# Patient Record
Sex: Female | Born: 1935
Health system: Southern US, Community
[De-identification: ages and names within clinical notes are randomized; demographics above are authoritative.]

## PROBLEM LIST (undated history)

## (undated) DIAGNOSIS — T7840XA Allergy, unspecified, initial encounter: Secondary | ICD-10-CM

## (undated) DIAGNOSIS — C801 Malignant (primary) neoplasm, unspecified: Secondary | ICD-10-CM

## (undated) DIAGNOSIS — H269 Unspecified cataract: Secondary | ICD-10-CM

## (undated) DIAGNOSIS — E119 Type 2 diabetes mellitus without complications: Secondary | ICD-10-CM

## (undated) DIAGNOSIS — M8580 Other specified disorders of bone density and structure, unspecified site: Secondary | ICD-10-CM

## (undated) DIAGNOSIS — E785 Hyperlipidemia, unspecified: Secondary | ICD-10-CM

## (undated) DIAGNOSIS — I10 Essential (primary) hypertension: Secondary | ICD-10-CM

## (undated) DIAGNOSIS — K449 Diaphragmatic hernia without obstruction or gangrene: Secondary | ICD-10-CM

## (undated) DIAGNOSIS — K219 Gastro-esophageal reflux disease without esophagitis: Secondary | ICD-10-CM

## (undated) HISTORY — DX: Malignant (primary) neoplasm, unspecified: C80.1

## (undated) HISTORY — DX: Hyperlipidemia, unspecified: E78.5

## (undated) HISTORY — DX: Unspecified cataract: H26.9

## (undated) HISTORY — PX: BREAST BIOPSY: SHX20

## (undated) HISTORY — DX: Other specified disorders of bone density and structure, unspecified site: M85.80

## (undated) HISTORY — DX: Essential (primary) hypertension: I10

## (undated) HISTORY — DX: Allergy, unspecified, initial encounter: T78.40XA

## (undated) HISTORY — DX: Type 2 diabetes mellitus without complications: E11.9

## (undated) HISTORY — DX: Diaphragmatic hernia without obstruction or gangrene: K44.9

## (undated) HISTORY — PX: BREAST LUMPECTOMY: SHX2

## (undated) HISTORY — DX: Gastro-esophageal reflux disease without esophagitis: K21.9

---

## 2017-11-18 ENCOUNTER — Other Ambulatory Visit: Payer: Self-pay | Admitting: Family Medicine

## 2017-11-18 DIAGNOSIS — Z1231 Encounter for screening mammogram for malignant neoplasm of breast: Secondary | ICD-10-CM

## 2017-12-10 ENCOUNTER — Other Ambulatory Visit: Payer: Self-pay | Admitting: Family Medicine

## 2017-12-10 ENCOUNTER — Ambulatory Visit
Admission: RE | Admit: 2017-12-10 | Discharge: 2017-12-10 | Disposition: A | Discharge status: Home / Self Care | Payer: Medicare Other | Source: Ambulatory Visit | Attending: Family Medicine | Admitting: Family Medicine

## 2017-12-10 DIAGNOSIS — Z1231 Encounter for screening mammogram for malignant neoplasm of breast: Secondary | ICD-10-CM

## 2018-03-11 ENCOUNTER — Other Ambulatory Visit: Payer: Self-pay

## 2018-03-11 ENCOUNTER — Ambulatory Visit: Payer: Medicare Other | Attending: Family Medicine

## 2018-03-11 DIAGNOSIS — R42 Dizziness and giddiness: Secondary | ICD-10-CM | POA: Diagnosis present

## 2018-03-11 DIAGNOSIS — R2689 Other abnormalities of gait and mobility: Secondary | ICD-10-CM | POA: Insufficient documentation

## 2018-03-11 DIAGNOSIS — M6281 Muscle weakness (generalized): Secondary | ICD-10-CM | POA: Insufficient documentation

## 2018-03-11 NOTE — Therapy (Signed)
[facility] [address] [place], [place], [address] Phone: [phone]   Fax:  [phone]  Physical Therapy Evaluation  Patient Details  Name: Andrea Wong MRN: [id number] Date of Birth: 01/16/1934 Referring Provider: Dr. Alroy Dust   Encounter Date: 03/11/2018  PT End of Session - 03/11/18 1244    Visit Number  1    Number of Visits  17    Date for PT Re-Evaluation  05/10/18    Authorization Type  [employer] Medicare: progress note every 10th visit. No visit limit.    PT Start Time  1017    PT Stop Time  1057    PT Time Calculation (min)  40 min    Equipment Utilized During Treatment  Gait belt    Activity Tolerance  Patient tolerated treatment well    Behavior During Therapy  WFL for tasks assessed/performed       Past Medical History:  Diagnosis Date  . Allergy    seasonal   . Cancer ([place])    breast CA (1970), Uterine CA (2003)  . Cataract    B cataracts extracted 2019  . Diabetes mellitus without complication ([place])   . GERD (gastroesophageal reflux disease)   . Hiatal hernia   . Hyperlipidemia   . Hypertension   . Osteopenia     Past Surgical History:  Procedure Laterality Date  . BREAST BIOPSY    . BREAST LUMPECTOMY Left    1970    There were no vitals filed for this visit.   Subjective Assessment - 03/11/18 1028    Subjective  Pt reports she's very unsteady on her feet, which began about 3-4 months ago. Pt denied illness or accident prior to balance issues. Pt has difficulty turning and stepping down from curbs. Pt amb. back to room with one hand on her husbands rollator.  Pt denied falls in last 6 months. Pt feels more unsteady vs. dizziness.     Patient is accompained by:  Family member husband: Andrea Wong    Pertinent History  DM, hx of breast and uterine CA (in remission-CA was years ago), osteopenia, recent B cataract extraction ( no improvement with balance), HTN, HLD    Patient Stated Goals  Walk  with better balance, walk longer distances for exercises    Currently in Pain?  No/denies         [facility] PT Assessment - 03/11/18 1034      Assessment   Medical Diagnosis  Balance disorder    Referring Provider  Dr. Alroy Dust    Onset Date/Surgical Date  11/09/17 3-4 months, getting progressively worse    Hand Dominance  Right    Prior Therapy  none      Precautions   Precautions  Fall      Restrictions   Weight Bearing Restrictions  No      Balance Screen   Has the patient fallen in the past 6 months  No    Has the patient had a decrease in activity level because of a fear of falling?   Yes    Is the patient reluctant to leave their home because of a fear of falling?   Yes      [place] residence    Living Arrangements  Spouse/significant other    Available Help at Discharge  Family dtr lives down the street    Type of [place] to  enter    Entrance Stairs-Number of Steps  2    Entrance Stairs-Rails  None    Home Layout  One level    Home Equipment  Grab bars - tub/shower;Shower seat;Bedside commode      Prior Function   Level of Independence  Independent    Vocation  Retired    [employer], play cards, crossword puzzles      Cognition   Overall Cognitive Status  Within Functional Limits for tasks assessed      Sensation   Light Touch  Appears Intact    Additional Comments  No c/o N/T      Coordination   Gross Motor Movements are Fluid and Coordinated  Yes      Posture/Postural Control   Posture/Postural Control  Postural limitations    Postural Limitations  Forward head      ROM / Strength   AROM / PROM / Strength  AROM;Strength      AROM   Overall AROM   Within functional limits for tasks performed    Overall AROM Comments  BUE/LE WFL      Strength   Overall Strength  Deficits    Overall Strength Comments  BLE: 5/5 strength except for 4/5 knee ext and hip abd. in seated position.        Transfers   Transfers  Sit to Stand;Stand to Sit    Sit to Stand  5: Supervision;Without upper extremity assist;From chair/3-in-1    Stand to Sit  5: Supervision;Without upper extremity assist;To chair/3-in-1      Ambulation/Gait   Ambulation/Gait  Yes    Ambulation/Gait Assistance  5: Supervision;4: Min guard    Ambulation/Gait Assistance Details  Min guard during turns (intermittently), cues to improve heel strike.    Ambulation Distance (Feet)  100 Feet    Assistive device  None    Gait Pattern  Step-through pattern;Decreased arm swing - left;Decreased arm swing - right;Decreased stride length;Decreased dorsiflexion - left;Decreased dorsiflexion - right    Ambulation Surface  Level;Indoor      Standardized Balance Assessment   Standardized Balance Assessment  Dynamic Gait Index;Timed Up and Go Test      Dynamic Gait Index   Level Surface  Mild Impairment    Change in Gait Speed  Moderate Impairment    Gait with Horizontal Head Turns  Moderate Impairment    Gait with Vertical Head Turns  Moderate Impairment    Gait and Pivot Turn  Moderate Impairment    Step Over Obstacle  Moderate Impairment    Step Around Obstacles  Mild Impairment    Steps  Moderate Impairment    Total Score  10    DGI comment:  10/24: high falls risk      Timed Up and Go Test   TUG  Normal TUG    Normal TUG (seconds)  13.3 no AD                Objective measurements completed on examination: See above findings.              PT Education - 03/11/18 1243    Education Details  PT discussed PT POC, frequency, and duration. PT educated pt on outcome measure results.     Person(s) Educated  Spouse    Methods  Explanation    Comprehension  Verbalized understanding       PT Short Term Goals - 03/11/18 1249      PT SHORT TERM  GOAL #1   Title  Pt will be IND with HEP to improve deficits listed above. TARGET DATE FOR ALL STGS: 04/08/18    Status  New      PT SHORT TERM GOAL #2   Title   Pt will improve DGI score to >/=14/24 to decr. falls risk.     Status  New      PT SHORT TERM GOAL #3   Title  Pt will amb. 300' over even terrain at MOD I level with LRAD to improve functional mobility.     Status  New      PT SHORT TERM GOAL #4   Title  Monitor for dizziness and perform vestibular exam as indicated.     Status  New        PT Long Term Goals - 03/11/18 1251      PT LONG TERM GOAL #1   Title  Pt will verbalize understanding of fall prevention strategies to improve safety. TARGET DATE FOR ALL LTGS: 05/06/18    Status  New      PT LONG TERM GOAL #2   Title  Pt will improve DGI score to >/=18/24 to decr. falls risk.     Status  New      PT LONG TERM GOAL #3   Title  Pt will amb. 500' over even/uneven terrain with LRAD at MOD I level to improve functional mobility.     Status  New      PT LONG TERM GOAL #4   Title  Pt will improve gait speed with LRAD to >/=3.35ft/sec.  to safely amb. in the community.     Status  New             Plan - 03/11/18 1245    Clinical Impression Statement  Pt is a pleasant 84y/o female presenting to OPPT neuro for balance impairments. PMH is significant for the following: DM, hx of breast and uterine CA (in remission-CA was years ago), osteopenia, recent B cataract extraction ( no improvement with balance), HTN, HLD. Pt's DGI score indicates pt is at a high risk for falls. Pt's gait speed was just above the cut-off for safe community ambulator and TUG time indicated pt was not a high risk for falls. However, based on DGI and pt's intermittent use of husband's rollator and intermittent unsteadiness during turns, PT feels pt is at a high risk for falls. The following impairments were noted upon exam: gait deviations, impaired balance, impaired posture, decr. endurance, and decr strength. Therefore, pt would benefit from skilled PT to improve safety during functional mobility.     History and Personal Factors relevant to plan of care:   Pt's husband uses rollator and cannot assist pt if she falls, inactive    Clinical Presentation  Stable    Clinical Presentation due to:  DM, hx of breast and uterine CA (in remission-CA was years ago), osteopenia, recent B cataract extraction ( no improvement with balance), HTN, HLD    Clinical Decision Making  Low    Rehab Potential  Good    Clinical Impairments Affecting Rehab Potential  see above.     PT Frequency  2x / week    PT Duration  8 weeks    PT Treatment/Interventions  ADLs/Self Care Home Management;Biofeedback;Canalith Repostioning;Therapeutic activities;Therapeutic exercise;Manual techniques;Vestibular;Functional mobility training;Stair training;Orthotic Fit/Training;Gait training;Patient/family education;DME Instruction;Neuromuscular re-education;Balance training    PT Next Visit Plan  Provide pt with OTAGO and perform vestibular exam as needed. Fall prevention  ed.    Consulted and Agree with Plan of Care  Family member/caregiver;Patient    Family Member Consulted  pt's husband: Andrea Wong       Patient will benefit from skilled therapeutic intervention in order to improve the following deficits and impairments:  Abnormal gait, Decreased endurance, Decreased knowledge of use of DME, Decreased strength, Decreased balance, Decreased mobility, Dizziness, Postural dysfunction, Impaired flexibility(pt described dizziness as not spinning but unsteadiness)  Visit Diagnosis: Other abnormalities of gait and mobility - Plan: PT plan of care cert/re-cert  Dizziness and giddiness - Plan: PT plan of care cert/re-cert  Muscle weakness (generalized) - Plan: PT plan of care cert/re-cert     Problem List There are no active problems to display for this patient.   Andrea Wong L 03/11/2018, 12:54 PM  [facility] [address] [place], [place], [address] Phone: [phone]   Fax:  [phone]  Name: Andrea Wong MRN:  [id number] Date of Birth: 06/11/1934  Geoffry Paradise, PT,DPT 03/11/18 12:54 PM Phone: [phone] Fax: [phone]

## 2018-03-26 ENCOUNTER — Encounter

## 2018-04-01 ENCOUNTER — Ambulatory Visit: Payer: Medicare Other

## 2018-04-01 DIAGNOSIS — R2689 Other abnormalities of gait and mobility: Secondary | ICD-10-CM

## 2018-04-01 DIAGNOSIS — M6281 Muscle weakness (generalized): Secondary | ICD-10-CM

## 2018-04-01 NOTE — Therapy (Signed)
[facility] [address] [place], [place], [address] Phone: [phone]   Fax:  [phone]  Physical Therapy Treatment  Patient Details  Name: Andrea Wong MRN: [id number] Date of Birth: 1933/11/27 Referring Provider: Dr. Alroy Dust   Encounter Date: 04/01/2018  PT End of Session - 04/01/18 1328    Visit Number  2    Number of Visits  17    Date for PT Re-Evaluation  05/10/18    Authorization Type  [employer]HC Medicare: progress note every 10th visit. No visit limit.    PT Start Time  1319    PT Stop Time  1359    PT Time Calculation (min)  40 min    Equipment Utilized During Treatment  --   S for saferty   Activity Tolerance  Patient tolerated treatment well    Behavior During Therapy  WFL for tasks assessed/performed       Past Medical History:  Diagnosis Date  . Allergy    seasonal   . Cancer ([place])    breast CA (1970), Uterine CA (2003)  . Cataract    B cataracts extracted 2019  . Diabetes mellitus without complication ([place])   . GERD (gastroesophageal reflux disease)   . Hiatal hernia   . Hyperlipidemia   . Hypertension   . Osteopenia     Past Surgical History:  Procedure Laterality Date  . BREAST BIOPSY    . BREAST LUMPECTOMY Left    1970    There were no vitals filed for this visit.  Subjective Assessment - 04/01/18 1322    Subjective  Pt denied falls or changes since last visit.     Patient is accompained by:  Family member   Iona Beard: husband   Pertinent History  DM, hx of breast and uterine CA (in remission-CA was years ago), osteopenia, recent B cataract extraction ( no improvement with balance), HTN, HLD    Patient Stated Goals  Walk with better balance, walk longer distances for exercises    Currently in Pain?  No/denies                            Balance Exercises - 04/01/18 1328      OTAGO PROGRAM   Head Movements  Standing;5 reps    Neck Movements  5 reps    supine   Back Extension  Standing;5 reps    Trunk Movements  Standing;5 reps    Ankle Movements  Sitting;10 reps    Knee Extensor  Other reps (comment)   x30 reps no band on LLE, x30 reps c yellow band RLE   Knee Flexor  20 reps    Hip ABductor  20 reps   with UE support   Ankle Plantorflexors  20 reps, support    Ankle Dorsiflexors  20 reps, support    Overall OTAGO Comments  Performed with tactile cues, VCs, and demo for technique. Intermittent UE support for safety.         PT Education - 04/01/18 1327    Education Details  Fall prevention handout and OTAGO HEP.     Person(s) Educated  Patient;Spouse    Methods  Explanation;Demonstration;Verbal cues;Handout    Comprehension  Returned demonstration;Verbalized understanding       PT Short Term Goals - 03/11/18 1249      PT SHORT TERM GOAL #1   Title  Pt will be IND with HEP to improve deficits listed  above. TARGET DATE FOR ALL STGS: 04/08/18    Status  New      PT SHORT TERM GOAL #2   Title  Pt will improve DGI score to >/=14/24 to decr. falls risk.     Status  New      PT SHORT TERM GOAL #3   Title  Pt will amb. 300' over even terrain at MOD I level with LRAD to improve functional mobility.     Status  New      PT SHORT TERM GOAL #4   Title  Monitor for dizziness and perform vestibular exam as indicated.     Status  New        PT Long Term Goals - 03/11/18 1251      PT LONG TERM GOAL #1   Title  Pt will verbalize understanding of fall prevention strategies to improve safety. TARGET DATE FOR ALL LTGS: 05/06/18    Status  New      PT LONG TERM GOAL #2   Title  Pt will improve DGI score to >/=18/24 to decr. falls risk.     Status  New      PT LONG TERM GOAL #3   Title  Pt will amb. 500' over even/uneven terrain with LRAD at MOD I level to improve functional mobility.     Status  New      PT LONG TERM GOAL #4   Title  Pt will improve gait speed with LRAD to >/=3.26ft/sec.  to safely amb. in the community.      Status  New            Plan - 04/01/18 1402    Clinical Impression Statement  Today's skilled session focused on establishing OTAGO HEP to improve strength, balance, and flexibility. PT also provided falls handout to reduce falls risk. Continue with POC.    Rehab Potential  Good    Clinical Impairments Affecting Rehab Potential  see above.     PT Frequency  2x / week    PT Duration  8 weeks    PT Treatment/Interventions  ADLs/Self Care Home Management;Biofeedback;Canalith Repostioning;Therapeutic activities;Therapeutic exercise;Manual techniques;Vestibular;Functional mobility training;Stair training;Orthotic Fit/Training;Gait training;Patient/family education;DME Instruction;Neuromuscular re-education;Balance training    PT Next Visit Plan  Finish  OTAGO initiation of HEP and perform vestibular exam as needed.     Consulted and Agree with Plan of Care  Family member/caregiver;Patient    Family Member Consulted  pt's husband: Iona Beard       Patient will benefit from skilled therapeutic intervention in order to improve the following deficits and impairments:  Abnormal gait, Decreased endurance, Decreased knowledge of use of DME, Decreased strength, Decreased balance, Decreased mobility, Dizziness, Postural dysfunction, Impaired flexibility(pt described dizziness as not spinning but unsteadiness)  Visit Diagnosis: Other abnormalities of gait and mobility  Muscle weakness (generalized)     Problem List There are no active problems to display for this patient.   Laqueena Hinchey L 04/01/2018, 2:03 PM  [facility] [address] [place], [place], [address] Phone: [phone]   Fax:  [phone]  Name: Andrea Wong MRN: [id number] Date of Birth: 11/14/1933  Geoffry Paradise, PT,DPT 04/01/18 2:04 PM Phone: [phone] Fax: [phone]

## 2018-04-01 NOTE — Patient Instructions (Signed)
Fall Prevention in the Home Falls can cause injuries and can affect people from all age groups. There are many simple things that you can do to make your home safe and to help prevent falls. What can I do on the outside of my home?  Regularly repair the edges of walkways and driveways and fix any cracks.  Remove high doorway thresholds.  Trim any shrubbery on the main path into your home.  Use bright outdoor lighting.  Clear walkways of debris and clutter, including tools and rocks.  Regularly check that handrails are securely fastened and in good repair. Both sides of any steps should have handrails.  Install guardrails along the edges of any raised decks or porches.  Have leaves, snow, and ice cleared regularly.  Use sand or salt on walkways during winter months.  In the garage, clean up any spills right away, including grease or oil spills. What can I do in the bathroom?  Use night lights.  Install grab bars by the toilet and in the tub and shower. Do not use towel bars as grab bars.  Use non-skid mats or decals on the floor of the tub or shower.  If you need to sit down while you are in the shower, use a plastic, non-slip stool.  Keep the floor dry. Immediately clean up any water that spills on the floor.  Remove soap buildup in the tub or shower on a regular basis.  Attach bath mats securely with double-sided non-slip rug tape.  Remove throw rugs and other tripping hazards from the floor. What can I do in the bedroom?  Use night lights.  Make sure that a bedside light is easy to reach.  Do not use oversized bedding that drapes onto the floor.  Have a firm chair that has side arms to use for getting dressed.  Remove throw rugs and other tripping hazards from the floor. What can I do in the kitchen?  Clean up any spills right away.  Avoid walking on wet floors.  Place frequently used items in easy-to-reach places.  If you need to reach for something above  you, use a sturdy step stool that has a grab bar.  Keep electrical cables out of the way.  Do not use floor polish or wax that makes floors slippery. If you have to use wax, make sure that it is non-skid floor wax.  Remove throw rugs and other tripping hazards from the floor. What can I do in the stairways?  Do not leave any items on the stairs.  Make sure that there are handrails on both sides of the stairs. Fix handrails that are broken or loose. Make sure that handrails are as long as the stairways.  Check any carpeting to make sure that it is firmly attached to the stairs. Fix any carpet that is loose or worn.  Avoid having throw rugs at the top or bottom of stairways, or secure the rugs with carpet tape to prevent them from moving.  Make sure that you have a light switch at the top of the stairs and the bottom of the stairs. If you do not have them, have them installed. What are some other fall prevention tips?  Wear closed-toe shoes that fit well and support your feet. Wear shoes that have rubber soles or low heels.  When you use a stepladder, make sure that it is completely opened and that the sides are firmly locked. Have someone hold the ladder while you are using   it. Do not climb a closed stepladder.  Add color or contrast paint or tape to grab bars and handrails in your home. Place contrasting color strips on the first and last steps.  Use mobility aids as needed, such as canes, walkers, scooters, and crutches.  Turn on lights if it is dark. Replace any light bulbs that burn out.  Set up furniture so that there are clear paths. Keep the furniture in the same spot.  Fix any uneven floor surfaces.  Choose a carpet design that does not hide the edge of steps of a stairway.  Be aware of any and all pets.  Review your medicines with your healthcare provider. Some medicines can cause dizziness or changes in blood pressure, which increase your risk of falling. Talk with  your health care provider about other ways that you can decrease your risk of falls. This may include working with a physical therapist or trainer to improve your strength, balance, and endurance. This information is not intended to replace advice given to you by your health care provider. Make sure you discuss any questions you have with your health care provider. Document Released: 07/18/2002 Document Revised: 12/25/2015 Document Reviewed: 09/01/2014 Elsevier Interactive Patient Education  2018 [employer]  

## 2018-04-02 ENCOUNTER — Ambulatory Visit: Payer: Medicare Other

## 2018-04-02 DIAGNOSIS — R2689 Other abnormalities of gait and mobility: Secondary | ICD-10-CM

## 2018-04-02 DIAGNOSIS — M6281 Muscle weakness (generalized): Secondary | ICD-10-CM

## 2018-04-02 NOTE — Therapy (Signed)
[facility] [address] [place], [place], [address] Phone: [phone]   Fax:  [phone]  Physical Therapy Treatment  Patient Details  Name: Andrea Wong MRN: [id number] Date of Birth: [birth year] Referring Provider: Dr. Alroy Dust   Encounter Date: 04/02/2018  PT End of Session - 04/02/18 1015    Visit Number  3    Number of Visits  17    Date for PT Re-Evaluation  05/10/18    Authorization Type  [employer] Medicare: progress note every 10th visit. No visit limit.    PT Start Time  0935    PT Stop Time  1015    PT Time Calculation (min)  40 min    Equipment Utilized During Treatment  --   min guard to S prn (min A during backwards tandem)   Activity Tolerance  Patient tolerated treatment well    Behavior During Therapy  WFL for tasks assessed/performed       Past Medical History:  Diagnosis Date  . Allergy    seasonal   . Cancer ([place])    breast CA (1970), Uterine CA (2003)  . Cataract    B cataracts extracted 2019  . Diabetes mellitus without complication ([place])   . GERD (gastroesophageal reflux disease)   . Hiatal hernia   . Hyperlipidemia   . Hypertension   . Osteopenia     Past Surgical History:  Procedure Laterality Date  . BREAST BIOPSY    . BREAST LUMPECTOMY Left    1970    There were no vitals filed for this visit.  Subjective Assessment - 04/02/18 0937    Subjective  Pt denied falls or changes since last visit.     Patient is accompained by:  Iona Beard in the lobby   Pertinent History  DM, hx of breast and uterine CA (in remission-CA was years ago), osteopenia, recent B cataract extraction ( no improvement with balance), HTN, HLD    Patient Stated Goals  Walk with better balance, walk longer distances for exercises    Currently in Pain?  No/denies                            Balance Exercises - 04/02/18 0940      OTAGO PROGRAM   Head Movements  Standing;5 reps    cues to stop trunk rotation   Neck Movements  Sitting;5 reps    Back Extension  Standing;5 reps    Trunk Movements  Standing;5 reps    Ankle Movements  10 reps;Sitting    Knee Extensor  10 reps   for cues   Knee Flexor  10 reps   for cues to decr. B hip ext.   Hip ABductor  10 reps   cues to take half a step back   Ankle Plantorflexors  20 reps, support    Ankle Dorsiflexors  20 reps, support    Knee Bends  20 reps, support   with chair behind pt to ensure proper technique.    Backwards Walking  Support    Walking and Turning Around  Assistive device   pt required min A and extensive cues   Sideways Walking  Assistive device    Tandem Stance  10 seconds, support    Tandem Walk  Support    One Leg Stand  10 seconds, support    Heel Walking  Support    Toe Walk  Support  Heel Toe Walking Backward  No support   with support but required min guard-min A for balance   Sit to Stand  10 reps, no support    Stair Walking  --   n/a   Overall OTAGO Comments  Pt required cues and demo for technique.         PT Education - 04/02/18 1014    Education Details  PT finished OTAGO HEP.     Person(s) Educated  Patient    Methods  Explanation;Demonstration;Tactile cues;Verbal cues;Handout    Comprehension  Returned demonstration;Verbalized understanding       PT Short Term Goals - 03/11/18 1249      PT SHORT TERM GOAL #1   Title  Pt will be IND with HEP to improve deficits listed above. TARGET DATE FOR ALL STGS: 04/08/18    Status  New      PT SHORT TERM GOAL #2   Title  Pt will improve DGI score to >/=14/24 to decr. falls risk.     Status  New      PT SHORT TERM GOAL #3   Title  Pt will amb. 300' over even terrain at MOD I level with LRAD to improve functional mobility.     Status  New      PT SHORT TERM GOAL #4   Title  Monitor for dizziness and perform vestibular exam as indicated.     Status  New        PT Long Term Goals - 03/11/18 1251      PT LONG TERM GOAL #1    Title  Pt will verbalize understanding of fall prevention strategies to improve safety. TARGET DATE FOR ALL LTGS: 05/06/18    Status  New      PT LONG TERM GOAL #2   Title  Pt will improve DGI score to >/=18/24 to decr. falls risk.     Status  New      PT LONG TERM GOAL #3   Title  Pt will amb. 500' over even/uneven terrain with LRAD at MOD I level to improve functional mobility.     Status  New      PT LONG TERM GOAL #4   Title  Pt will improve gait speed with LRAD to >/=3.31ft/sec.  to safely amb. in the community.     Status  New            Plan - 04/02/18 1015    Clinical Impression Statement  Today's skilled session focused on finishing OTAGO HEP and reviewing activiites from yesterday to ensue proper technique. Pt experienced incr. sway during high level balance activities, therefore, PT ensured pt only performed activities at home which were moderately challenging but safe. Continue with POC.     Rehab Potential  Good    Clinical Impairments Affecting Rehab Potential  see above.     PT Frequency  2x / week    PT Duration  8 weeks    PT Treatment/Interventions  ADLs/Self Care Home Management;Biofeedback;Canalith Repostioning;Therapeutic activities;Therapeutic exercise;Manual techniques;Vestibular;Functional mobility training;Stair training;Orthotic Fit/Training;Gait training;Patient/family education;DME Instruction;Neuromuscular re-education;Balance training    PT Next Visit Plan  High level balance activities and  perform vestibular exam as needed.     Consulted and Agree with Plan of Care  Family member/caregiver;Patient    Family Member Consulted  pt's husband: Iona Beard       Patient will benefit from skilled therapeutic intervention in order to improve the following deficits and impairments:  Abnormal  gait, Decreased endurance, Decreased knowledge of use of DME, Decreased strength, Decreased balance, Decreased mobility, Dizziness, Postural dysfunction, Impaired  flexibility(pt described dizziness as not spinning but unsteadiness)  Visit Diagnosis: Other abnormalities of gait and mobility  Muscle weakness (generalized)     Problem List There are no active problems to display for this patient.   Andrea Wong 04/02/2018, 10:17 AM  [facility] [address] [place], [place], [address] Phone: [phone]   Fax:  [phone]  Name: Andrea Wong MRN: [id number] Date of Birth: 1934/02/09  Geoffry Paradise, PT,DPT 04/02/18 10:17 AM Phone: [phone] Fax: [phone]

## 2018-04-07 ENCOUNTER — Ambulatory Visit: Payer: Medicare Other

## 2018-04-07 DIAGNOSIS — R2689 Other abnormalities of gait and mobility: Secondary | ICD-10-CM | POA: Diagnosis not present

## 2018-04-07 NOTE — Therapy (Signed)
[facility] [address] [place], [place], [address] Phone: [phone]   Fax:  [phone]  Physical Therapy Treatment  Patient Details  Name: Andrea Wong MRN: [id number] Date of Birth: 1933-10-03 Referring Provider: Dr. Alroy Dust   Encounter Date: 04/07/2018  PT End of Session - 04/07/18 1101    Visit Number  4    Number of Visits  17    Date for PT Re-Evaluation  05/10/18    Authorization Type  [employer] Medicare: progress note every 10th visit. No visit limit.    PT Start Time  1020    PT Stop Time  1059    PT Time Calculation (min)  39 min    Equipment Utilized During Treatment  --   min guard to S prn   Activity Tolerance  Patient tolerated treatment well    Behavior During Therapy  WFL for tasks assessed/performed       Past Medical History:  Diagnosis Date  . Allergy    seasonal   . Cancer ([place])    breast CA (1970), Uterine CA (2003)  . Cataract    B cataracts extracted 2019  . Diabetes mellitus without complication ([place])   . GERD (gastroesophageal reflux disease)   . Hiatal hernia   . Hyperlipidemia   . Hypertension   . Osteopenia     Past Surgical History:  Procedure Laterality Date  . BREAST BIOPSY    . BREAST LUMPECTOMY Left    1970    There were no vitals filed for this visit.  Subjective Assessment - 04/07/18 1023    Subjective  Pt denied falls or changes since last visit. Pt denied dizziness, just feels unsteady.     Pertinent History  DM, hx of breast and uterine CA (in remission-CA was years ago), osteopenia, recent B cataract extraction ( no improvement with balance), HTN, HLD    Patient Stated Goals  Walk with better balance, walk longer distances for exercises    Currently in Pain?  No/denies                       [facility] Adult PT Treatment/Exercise - 04/07/18 1028      Ambulation/Gait   Ambulation/Gait  Yes    Ambulation/Gait Assistance  5: Supervision;4: Min  guard    Ambulation/Gait Assistance Details  Pt amb. with rollator and SBQC and SPC with quad tip with cues for sequencing, heel strike and upright posture.  Cues and demo for sequencing with each AD.     Ambulation Distance (Feet)  200 Feet   c rollator, 115' with both canes, 500' outdoors   Assistive device  None;Rollator;Small based quad cane;Other (Comment)   SPC with quad tip   Gait Pattern  Step-through pattern;Decreased arm swing - left;Decreased arm swing - right;Decreased stride length;Decreased dorsiflexion - left;Decreased dorsiflexion - right    Ambulation Surface  Level;Indoor    Gait velocity  2.79 ft/sec. with rollator, 2.64ft/sec. no AD,              PT Education - 04/07/18 1100    Education Details  PT educated pt on the importance of using rollator during amb. for safety, at least when out in the community. Pt reluctant but agreeable at the end.     Person(s) Educated  Patient;Spouse    Methods  Explanation    Comprehension  Verbalized understanding;Returned demonstration;Need further instruction       PT Short Term Goals -  03/11/18 1249      PT SHORT TERM GOAL #1   Title  Pt will be IND with HEP to improve deficits listed above. TARGET DATE FOR ALL STGS: 04/08/18    Status  New      PT SHORT TERM GOAL #2   Title  Pt will improve DGI score to >/=14/24 to decr. falls risk.     Status  New      PT SHORT TERM GOAL #3   Title  Pt will amb. 300' over even terrain at MOD I level with LRAD to improve functional mobility.     Status  New      PT SHORT TERM GOAL #4   Title  Monitor for dizziness and perform vestibular exam as indicated.     Status  New        PT Long Term Goals - 03/11/18 1251      PT LONG TERM GOAL #1   Title  Pt will verbalize understanding of fall prevention strategies to improve safety. TARGET DATE FOR ALL LTGS: 05/06/18    Status  New      PT LONG TERM GOAL #2   Title  Pt will improve DGI score to >/=18/24 to decr. falls risk.      Status  New      PT LONG TERM GOAL #3   Title  Pt will amb. 500' over even/uneven terrain with LRAD at MOD I level to improve functional mobility.     Status  New      PT LONG TERM GOAL #4   Title  Pt will improve gait speed with LRAD to >/=3.69ft/sec.  to safely amb. in the community.     Status  New            Plan - 04/07/18 1101    Clinical Impression Statement  Pt demonstrated improved balance, safety, and decr. gait deviations during amb. with rollator vs. no AD. Pt had difficulty sequencing with canes (SBQC and SPC with quad tip), therefore, not recommended. PT provided extensive education to pt and then spouse in lobby on why rollator is safest for pt to use during amb. Pt would continue to benefit from skilled PT to improve safety during functional mobility.     Rehab Potential  Good    Clinical Impairments Affecting Rehab Potential  see above.     PT Frequency  2x / week    PT Duration  8 weeks    PT Treatment/Interventions  ADLs/Self Care Home Management;Biofeedback;Canalith Repostioning;Therapeutic activities;Therapeutic exercise;Manual techniques;Vestibular;Functional mobility training;Stair training;Orthotic Fit/Training;Gait training;Patient/family education;DME Instruction;Neuromuscular re-education;Balance training    PT Next Visit Plan  High level balance activities and  perform vestibular exam as needed. Gait with rollator. Check STGs week of 04/19/18, as pt missed 2 weeks of PT 2/2 schedule was full.    Consulted and Agree with Plan of Care  Family member/caregiver;Patient    Family Member Consulted  pt's husband: Andrea Wong       Patient will benefit from skilled therapeutic intervention in order to improve the following deficits and impairments:  Abnormal gait, Decreased endurance, Decreased knowledge of use of DME, Decreased strength, Decreased balance, Decreased mobility, Dizziness, Postural dysfunction, Impaired flexibility(pt described dizziness as not spinning but  unsteadiness)  Visit Diagnosis: Other abnormalities of gait and mobility     Problem List There are no active problems to display for this patient.   Andrea Wong L 04/07/2018, 11:04 AM  [facility] [phone]  [place], [place], [address] Phone: [phone]   Fax:  [phone]  Name: Andrea Wong MRN: [id number] Date of Birth: [birth year]  Geoffry Paradise, PT,DPT 04/07/18 11:04 AM Phone: [phone] Fax: [phone]

## 2018-04-09 ENCOUNTER — Ambulatory Visit: Payer: Medicare Other

## 2018-04-09 DIAGNOSIS — R2689 Other abnormalities of gait and mobility: Secondary | ICD-10-CM

## 2018-04-09 DIAGNOSIS — R42 Dizziness and giddiness: Secondary | ICD-10-CM

## 2018-04-09 NOTE — Therapy (Signed)
[facility] [address] [place], [place], [address] Phone: [phone]   Fax:  [phone]  Physical Therapy Treatment  Patient Details  Name: Andrea Wong MRN: [id number] Date of Birth: 11-Nov-1933 Referring Provider: Dr. Alroy Dust   Encounter Date: 04/09/2018  PT End of Session - 04/09/18 1059    Visit Number  5    Number of Visits  17    Date for PT Re-Evaluation  05/10/18    Authorization Type  [employer] Medicare: progress note every 10th visit. No visit limit.    PT Start Time  1018    PT Stop Time  1058    PT Time Calculation (min)  40 min    Equipment Utilized During Treatment  Gait belt    Activity Tolerance  Patient tolerated treatment well    Behavior During Therapy  WFL for tasks assessed/performed       Past Medical History:  Diagnosis Date  . Allergy    seasonal   . Cancer ([place])    breast CA (1970), Uterine CA (2003)  . Cataract    B cataracts extracted 2019  . Diabetes mellitus without complication ([place])   . GERD (gastroesophageal reflux disease)   . Hiatal hernia   . Hyperlipidemia   . Hypertension   . Osteopenia     Past Surgical History:  Procedure Laterality Date  . BREAST BIOPSY    . BREAST LUMPECTOMY Left    1970    There were no vitals filed for this visit.  Subjective Assessment - 04/09/18 1020    Subjective  Pt denied falls or changes since last visit. Pt denied dizziness, just feels unsteady. Pt denied pain at rest but reported R shoulder has been painful lately, not sure if she slept on it wrong.     Pertinent History  DM, hx of breast and uterine CA (in remission-CA was years ago), osteopenia, recent B cataract extraction ( no improvement with balance), HTN, HLD    Patient Stated Goals  Walk with better balance, walk longer distances for exercises    Currently in Pain?  No/denies                       [facility] Adult PT Treatment/Exercise - 04/09/18 1058       High Level Balance   High Level Balance Activities  Side stepping;Backward walking;Tandem walking;Marching forwards;Other (comment)   forward amb. c and s eye closed   High Level Balance Comments  Performed in // bars with intermittent UE support and min guard to min A for safety, over red mat. 4x7'/activity. Cues and demo for technique.           Balance Exercises - 04/09/18 1022      Balance Exercises: Standing   Rockerboard  Anterior/posterior;Lateral;Head turns;EO;10 seconds;EC;30 seconds;10 reps;Intermittent UE support   short board   Other Standing Exercises  Performed in // bars with min guard to min A for safety. Cues and demo for technique.           PT Short Term Goals - 03/11/18 1249      PT SHORT TERM GOAL #1   Title  Pt will be IND with HEP to improve deficits listed above. TARGET DATE FOR ALL STGS: 04/08/18    Status  New      PT SHORT TERM GOAL #2   Title  Pt will improve DGI score to >/=14/24 to decr. falls risk.  Status  New      PT SHORT TERM GOAL #3   Title  Pt will amb. 300' over even terrain at MOD I level with LRAD to improve functional mobility.     Status  New      PT SHORT TERM GOAL #4   Title  Monitor for dizziness and perform vestibular exam as indicated.     Status  New        PT Long Term Goals - 03/11/18 1251      PT LONG TERM GOAL #1   Title  Pt will verbalize understanding of fall prevention strategies to improve safety. TARGET DATE FOR ALL LTGS: 05/06/18    Status  New      PT LONG TERM GOAL #2   Title  Pt will improve DGI score to >/=18/24 to decr. falls risk.     Status  New      PT LONG TERM GOAL #3   Title  Pt will amb. 500' over even/uneven terrain with LRAD at MOD I level to improve functional mobility.     Status  New      PT LONG TERM GOAL #4   Title  Pt will improve gait speed with LRAD to >/=3.38ft/sec.  to safely amb. in the community.     Status  New            Plan - 04/09/18 1059    Clinical  Impression Statement  Pt demonstrated progress, as she tolerated high level balance activities well and did not require a rest break or report dizziness. Pt did require min guard to min A to maintain balance, especially during activiites which required incr. vestibular input. Pt noted to amb. without rollator again today, with incr. gait deviations and decr. arm swing, PT reiterated the importance of using AD in the community. Continue with POC.     Rehab Potential  Good    Clinical Impairments Affecting Rehab Potential  see above.     PT Frequency  2x / week    PT Duration  8 weeks    PT Treatment/Interventions  ADLs/Self Care Home Management;Biofeedback;Canalith Repostioning;Therapeutic activities;Therapeutic exercise;Manual techniques;Vestibular;Functional mobility training;Stair training;Orthotic Fit/Training;Gait training;Patient/family education;DME Instruction;Neuromuscular re-education;Balance training    PT Next Visit Plan  High level balance activities and  perform vestibular exam as needed. Gait with rollator. Check STGs week of 04/19/18, as pt missed 2 weeks of PT 2/2 schedule was full.    Consulted and Agree with Plan of Care  Family member/caregiver;Patient    Family Member Consulted  pt's husband: Andrea Wong       Patient will benefit from skilled therapeutic intervention in order to improve the following deficits and impairments:  Abnormal gait, Decreased endurance, Decreased knowledge of use of DME, Decreased strength, Decreased balance, Decreased mobility, Dizziness, Postural dysfunction, Impaired flexibility(pt described dizziness as not spinning but unsteadiness)  Visit Diagnosis: Other abnormalities of gait and mobility  Dizziness and giddiness     Problem List There are no active problems to display for this patient.   Andrea Wong 04/09/2018, 11:01 AM  [facility] [address] [place], [place],  [address] Phone: [phone]   Fax:  [phone]  Name: Andrea Wong MRN: [id number] Date of Birth: [birth year]  Andrea Wong, PT,DPT 04/09/18 11:01 AM Phone: [phone] Fax: [phone]

## 2018-04-13 ENCOUNTER — Ambulatory Visit: Payer: Medicare Other | Attending: Family Medicine | Admitting: Physical Therapy

## 2018-04-13 DIAGNOSIS — R42 Dizziness and giddiness: Secondary | ICD-10-CM | POA: Diagnosis present

## 2018-04-13 DIAGNOSIS — R2689 Other abnormalities of gait and mobility: Secondary | ICD-10-CM | POA: Diagnosis present

## 2018-04-13 DIAGNOSIS — M6281 Muscle weakness (generalized): Secondary | ICD-10-CM | POA: Insufficient documentation

## 2018-04-14 ENCOUNTER — Encounter: Payer: Self-pay | Admitting: Physical Therapy

## 2018-04-14 NOTE — Therapy (Signed)
[facility] [address] [place], [place], [address] Phone: [phone]   Fax:  [phone]  Physical Therapy Treatment  Patient Details  Name: Andrea Wong MRN: [id number] Date of Birth: February 17, 1934 Referring Provider: Dr. Alroy Dust   Encounter Date: 04/13/2018  PT End of Session - 04/14/18 1055    Visit Number  6    Number of Visits  17    Date for PT Re-Evaluation  05/10/18    Authorization Type  [employer] Medicare: progress note every 10th visit. No visit limit.    PT Start Time  1535    PT Stop Time  1619    PT Time Calculation (min)  44 min    Equipment Utilized During Treatment  Gait belt       Past Medical History:  Diagnosis Date  . Allergy    seasonal   . Cancer ([place])    breast CA (1970), Uterine CA (2003)  . Cataract    B cataracts extracted 2019  . Diabetes mellitus without complication ([facility])   . GERD (gastroesophageal reflux disease)   . Hiatal hernia   . Hyperlipidemia   . Hypertension   . Osteopenia     Past Surgical History:  Procedure Laterality Date  . BREAST BIOPSY    . BREAST LUMPECTOMY Left    1970    There were no vitals filed for this visit.  Subjective Assessment - 04/14/18 1050    Subjective  Pt inquires "does balance ever really get better?" Pt states she sees no change (no improvement) in her balance; says she is doing the exercises "in the red book" at home; pt not using device for assistance w/ amb. - states she does not want to use anything     Pertinent History  DM, hx of breast and uterine CA (in remission-CA was years ago), osteopenia, recent B cataract extraction ( no improvement with balance), HTN, HLD    Patient Stated Goals  Walk with better balance, walk longer distances for exercises                NeuroRe-ed;    Pt performed standing balance exercises on floor by // bar for UE support prn: Forward, back and side kicks 10 reps each - alternating  LE's Marching in place x 10 reps each; Marching in place with head turns horizontal and vertical 5 reps each with UE support with CGA to min assist  Marching forward and backward along // bar 2 reps with RUE support on // bar  Pt peformed standing on blue balance beam - perpendicular for improved hip strategy - UE support needed on // bar Pt then performed alternate tap downs from foam beam to floor with UE support with min assist Stepping over and back of blue foam beam 5 reps each leg with RUE support on // bar  Tap ups to 6" step - standing on floor - 10 reps each with minimal UE support on rail  Cone taps (3 cones used) with moderate UE support - tapping cones in various orders  - standing on floor          Balance Exercises - 04/14/18 1053      Balance Exercises: Standing   Tandem Stance  Eyes open;2 reps;30 secs   partial tandem stance - 30 sec hold with UE support   SLS  2 reps;Eyes open;Solid surface;10 secs   with UE support   Rockerboard  Anterior/posterior;Lateral;10 reps;UE support  Step Ups  Forward;6 inch;UE support 1   10 reps each leg   Sidestepping  2 reps   inside // bars       PT Education - 04/14/18 1055    Education Details  Pt continues to decline use of rollator; emphasized importance of SLS exercise     Person(s) Educated  Patient    Methods  Explanation    Comprehension  Verbalized understanding;Returned demonstration       PT Short Term Goals - 03/11/18 1249      PT SHORT TERM GOAL #1   Title  Pt will be IND with HEP to improve deficits listed above. TARGET DATE FOR ALL STGS: 04/08/18    Status  New      PT SHORT TERM GOAL #2   Title  Pt will improve DGI score to >/=14/24 to decr. falls risk.     Status  New      PT SHORT TERM GOAL #3   Title  Pt will amb. 300' over even terrain at MOD I level with LRAD to improve functional mobility.     Status  New      PT SHORT TERM GOAL #4   Title  Monitor for dizziness and perform vestibular  exam as indicated.     Status  New        PT Long Term Goals - 03/11/18 1251      PT LONG TERM GOAL #1   Title  Pt will verbalize understanding of fall prevention strategies to improve safety. TARGET DATE FOR ALL LTGS: 05/06/18    Status  New      PT LONG TERM GOAL #2   Title  Pt will improve DGI score to >/=18/24 to decr. falls risk.     Status  New      PT LONG TERM GOAL #3   Title  Pt will amb. 500' over even/uneven terrain with LRAD at MOD I level to improve functional mobility.     Status  New      PT LONG TERM GOAL #4   Title  Pt will improve gait speed with LRAD to >/=3.56ft/sec.  to safely amb. in the community.     Status  New            Plan - 04/14/18 1056    Clinical Impression Statement  Pt appears to have fear of falling as unable to attempt SLS activities without UE support, stating she was unable to lift either leg off floor in standing when not holding onto object or // bar; pt declines use of asisstive device.  Pt noted to have gait deviations including decreased step length to compensate for decr. SLS.      Rehab Potential  Good    PT Frequency  2x / week    PT Duration  8 weeks    PT Treatment/Interventions  ADLs/Self Care Home Management;Biofeedback;Canalith Repostioning;Therapeutic activities;Therapeutic exercise;Manual techniques;Vestibular;Functional mobility training;Stair training;Orthotic Fit/Training;Gait training;Patient/family education;DME Instruction;Neuromuscular re-education;Balance training    PT Next Visit Plan  Check STG's next session - due 04-19-18:  High level balance activities and  perform vestibular exam as needed. Gait with rollator? - pt declines use of device on 04-13-18    Consulted and Agree with Plan of Care  Patient       Patient will benefit from skilled therapeutic intervention in order to improve the following deficits and impairments:  Abnormal gait, Decreased endurance, Decreased knowledge of use of DME, Decreased strength,  Decreased  balance, Decreased mobility, Dizziness, Postural dysfunction, Impaired flexibility  Visit Diagnosis: Other abnormalities of gait and mobility     Problem List There are no active problems to display for this patient.   Alda Lea, PT 04/14/2018, 11:00 AM  [facility] [address] [place], [place], [address] Phone: [phone]   Fax:  [phone]  Name: Andrea Wong MRN: [id number] Date of Birth: 1933-09-25

## 2018-04-15 ENCOUNTER — Ambulatory Visit: Payer: Medicare Other

## 2018-04-15 DIAGNOSIS — R2689 Other abnormalities of gait and mobility: Secondary | ICD-10-CM | POA: Diagnosis not present

## 2018-04-15 DIAGNOSIS — R42 Dizziness and giddiness: Secondary | ICD-10-CM

## 2018-04-15 NOTE — Therapy (Signed)
[facility] [address] [place], [place], [address] Phone: [phone]   Fax:  [phone]  Physical Therapy Treatment  Patient Details  Name: Andrea Wong MRN: [id number] Date of Birth: 09/23/1933 Referring Provider: Dr. Alroy Dust   Encounter Date: 04/15/2018  PT End of Session - 04/15/18 1236    Visit Number  7    Number of Visits  17    Date for PT Re-Evaluation  05/10/18    Authorization Type  [employer] Medicare: progress note every 10th visit. No visit limit.    PT Start Time  1018    PT Stop Time  1102    PT Time Calculation (min)  44 min    Activity Tolerance  Patient tolerated treatment well    Behavior During Therapy  WFL for tasks assessed/performed       Past Medical History:  Diagnosis Date  . Allergy    seasonal   . Cancer ([place])    breast CA (1970), Uterine CA (2003)  . Cataract    B cataracts extracted 2019  . Diabetes mellitus without complication ([facility])   . GERD (gastroesophageal reflux disease)   . Hiatal hernia   . Hyperlipidemia   . Hypertension   . Osteopenia     Past Surgical History:  Procedure Laterality Date  . BREAST BIOPSY    . BREAST LUMPECTOMY Left    1970    There were no vitals filed for this visit.  Subjective Assessment - 04/15/18 1021    Subjective  Pt denied falls or changes since last visit. Pt used to walk about 1.5 miles prior to moving to [place]. Pt had LOB in lobby area but corrected as another pt gave his quad cane and then PT assisted pt.     Pertinent History  DM, hx of breast and uterine CA (in remission-CA was years ago), osteopenia, recent B cataract extraction ( no improvement with balance), HTN, HLD    Patient Stated Goals  Walk with better balance, walk longer distances for exercises    Currently in Pain?  No/denies             Vestibular Assessment - 04/15/18 1025      Vestibular Assessment   General Observation  Pt stated she hasn't had  dizziness in awhile but wants to make sure "everything is ok." Pt states she now just feels unsteady mainly during turns and traversing curbs.       Symptom Behavior   Type of Dizziness  Comment    Frequency of Dizziness  Intermittent, hasn't occurred in weeks    Duration of Dizziness  Minutes    Aggravating Factors  Sit to stand    Relieving Factors  Rest;Slow movements   sit back down     Occulomotor Exam   Occulomotor Alignment  Normal    Spontaneous  Absent    Smooth Pursuits  Intact    Saccades  Slow   pt had difficulty following commands (wanted to turn head)   Comment  B HIT (-).      Vestibulo-Occular Reflex   VOR 1 Head Only (x 1 viewing)  Pt required extensive cues to perform correctly, denied dizziness.     VOR Cancellation  Normal   required cues for proper technique.    Comment  Denied dizziness.       Orthostatics   BP supine (x 5 minutes)  113/62   BP in R UE, pt with hx of L mastectomy  HR supine (x 5 minutes)  64    BP sitting  119/64   no dizziness.    HR sitting  68    BP standing (after 1 minute)  118/72    HR standing (after 1 minute)  74    Orthostatics Comment  Pt denied dizziness during session.                [facility] Adult PT Treatment/Exercise - 04/15/18 1230      Bed Mobility   Bed Mobility  Sit to Supine;Right Sidelying to Sit    Right Sidelying to Sit  Minimal Assistance - Patient > 75%;Set up assist;Contact Guard/Touching assist;Supervision/Verbal cueing   cues and demo for safe and proper technique.    Sit to Supine  Supervision/Verbal cueing;Set up assist   S for safety and cues for improved technique.      Transfers   Transfers  Sit to Stand;Stand to Sit    Sit to Stand  5: Supervision;With upper extremity assist;From chair/3-in-1    Sit to Stand Details (indicate cue type and reason)  Cues on proper technique when performing STS txfs with rollator and brakes locked x4 reps.     Stand to Sit  5: Supervision;With upper extremity  assist;To chair/3-in-1    Stand to Sit Details  with rollator, see above. x4 reps      Ambulation/Gait   Ambulation/Gait  Yes    Ambulation/Gait Assistance  5: Supervision;4: Min guard    Ambulation/Gait Assistance Details  Pt amb. with and without rollator, min guard without rollator and S with rollator. Cues to improve weight shifting while traversing inclines/declines and sequencing with rollator.    Ambulation Distance (Feet)  400 Feet   and 100'x2 in/outdoors   Assistive device  Rollator;None    Gait Pattern  Step-through pattern;Decreased arm swing - left;Decreased arm swing - right;Decreased stride length;Decreased dorsiflexion - left;Decreased dorsiflexion - right    Ambulation Surface  Level;Indoor;Unlevel;Outdoor;Paved             PT Education - 04/15/18 1234    Education Details  PT again provided extensive education on why pt should utilize rollator at all times, especially after LOB in lobby prior to today's session. PT asked pt to bring her rollator next session, pt and husband agreeable.     Person(s) Educated  Patient;Spouse   spouse in lobby but spoke with him at end of session   Methods  Explanation    Comprehension  Verbalized understanding       PT Short Term Goals - 04/15/18 1239      PT SHORT TERM GOAL #1   Title  Pt will be IND with HEP to improve deficits listed above. TARGET DATE FOR ALL STGS: 04/08/18    Status  New      PT SHORT TERM GOAL #2   Title  Pt will improve DGI score to >/=14/24 to decr. falls risk.     Status  New      PT SHORT TERM GOAL #3   Title  Pt will amb. 300' over even terrain at MOD I level with LRAD to improve functional mobility.     Status  New      PT SHORT TERM GOAL #4   Title  Monitor for dizziness and perform vestibular exam as indicated.     Baseline  no goals need at this time.     Status  Achieved        PT Long Term  Goals - 03/11/18 1251      PT LONG TERM GOAL #1   Title  Pt will verbalize understanding of  fall prevention strategies to improve safety. TARGET DATE FOR ALL LTGS: 05/06/18    Status  New      PT LONG TERM GOAL #2   Title  Pt will improve DGI score to >/=18/24 to decr. falls risk.     Status  New      PT LONG TERM GOAL #3   Title  Pt will amb. 500' over even/uneven terrain with LRAD at MOD I level to improve functional mobility.     Status  New      PT LONG TERM GOAL #4   Title  Pt will improve gait speed with LRAD to >/=3.22ft/sec.  to safely amb. in the community.     Status  New            Plan - 04/15/18 1237    Clinical Impression Statement  PT performed vestibular exam, per pt request as she was originally sent to PT for balance and dizziness. However, pt has not experienced dizziness since PT began. Pt's vestibular exam was WNL, but pt did have difficulty following commands for technique which could alter results. Pt was negative for orthostatic hypotension. PT will continue to follow up on dizziness as indicated. Continue with POC.     Rehab Potential  Good    PT Frequency  2x / week    PT Duration  8 weeks    PT Treatment/Interventions  ADLs/Self Care Home Management;Biofeedback;Canalith Repostioning;Therapeutic activities;Therapeutic exercise;Manual techniques;Vestibular;Functional mobility training;Stair training;Orthotic Fit/Training;Gait training;Patient/family education;DME Instruction;Neuromuscular re-education;Balance training    PT Next Visit Plan  Check STG's next session - due 04-19-18:  High level balance activities and  perform vestibular exam as needed. Gait with rollator    Consulted and Agree with Plan of Care  Patient       Patient will benefit from skilled therapeutic intervention in order to improve the following deficits and impairments:  Abnormal gait, Decreased endurance, Decreased knowledge of use of DME, Decreased strength, Decreased balance, Decreased mobility, Dizziness, Postural dysfunction, Impaired flexibility  Visit Diagnosis: Dizziness  and giddiness  Other abnormalities of gait and mobility     Problem List There are no active problems to display for this patient.   Makaleigh Reinard L 04/15/2018, 12:42 PM  [facility] [address] [place], [place], [address] Phone: [phone]   Fax:  [phone]  Name: Nakai Yard MRN: [id number] Date of Birth: [birth year]  Geoffry Paradise, PT,DPT 04/15/18 12:42 PM Phone: [phone] Fax: [phone]

## 2018-04-21 ENCOUNTER — Ambulatory Visit: Payer: Medicare Other | Admitting: Physical Therapy

## 2018-04-21 ENCOUNTER — Encounter: Payer: Self-pay | Admitting: Physical Therapy

## 2018-04-21 DIAGNOSIS — R42 Dizziness and giddiness: Secondary | ICD-10-CM

## 2018-04-21 DIAGNOSIS — R2689 Other abnormalities of gait and mobility: Secondary | ICD-10-CM

## 2018-04-21 DIAGNOSIS — M6281 Muscle weakness (generalized): Secondary | ICD-10-CM

## 2018-04-22 NOTE — Therapy (Signed)
[facility] [address] [place], [place], [address] Phone: [phone]   Fax:  [phone]  Physical Therapy Treatment  Patient Details  Name: Andrea Wong MRN: [id number] Date of Birth: 20-Apr-1934 Referring Provider: Dr. Alroy Dust   Encounter Date: 04/21/2018     04/21/18 1023  PT Visits / Re-Eval  Visit Number 8  Number of Visits 17  Date for PT Re-Evaluation 05/10/18  Authorization  Authorization Type [employer] Medicare: progress note every 10th visit. No visit limit.  PT Time Calculation  PT Start Time 1018  PT Stop Time 1100  PT Time Calculation (min) 42 min  PT - End of Session  Equipment Utilized During Treatment Gait belt  Activity Tolerance Patient tolerated treatment well  Behavior During Therapy WFL for tasks assessed/performed    Past Medical History:  Diagnosis Date  . Allergy    seasonal   . Cancer ([place])    breast CA (1970), Uterine CA (2003)  . Cataract    B cataracts extracted 2019  . Diabetes mellitus without complication ([facility])   . GERD (gastroesophageal reflux disease)   . Hiatal hernia   . Hyperlipidemia   . Hypertension   . Osteopenia     Past Surgical History:  Procedure Laterality Date  . BREAST BIOPSY    . BREAST LUMPECTOMY Left    1970    There were no vitals filed for this visit.     04/21/18 1022  Symptoms/Limitations  Subjective No new complaitns. No falls. To clinic today with spouse's old rollator. Noted it to be too high at the lowest level and missing the seat.   Patient is accompained by:  (spouse, Iona Beard in lobby)  Pertinent History DM, hx of breast and uterine CA (in remission-CA was years ago), osteopenia, recent B cataract extraction ( no improvement with balance), HTN, HLD  Patient Stated Goals Walk with better balance, walk longer distances for exercises  Pain Assessment  Currently in Pain? Yes  Pain Score 6  Pain Location Shoulder  Pain Orientation Right   Pain Descriptors / Indicators Aching;Sore  Pain Type Acute pain  Pain Onset 1 to 4 weeks ago  Pain Frequency Intermittent  Aggravating Factors  certain movements  Pain Relieving Factors creame she has, resting it        04/21/18 1039  Transfers  Transfers Sit to Stand;Stand to Sit  Sit to Stand 5: Supervision;With upper extremity assist;From chair/3-in-1  Stand to Sit 5: Supervision;With upper extremity assist;To chair/3-in-1  Ambulation/Gait  Ambulation/Gait Yes  Ambulation/Gait Assistance 5: Supervision  Ambulation/Gait Assistance Details cues needed on hand position on rollator brakes for safety and for rollator position with gait. no imbalance noted on indoor/outdoor surfaces.                    Ambulation Distance (Feet) 1000 Feet (x1, plus around gym with activities)  Assistive device Rollator;None  Gait Pattern Step-through pattern;Decreased arm swing - left;Decreased arm swing - right;Decreased stride length;Decreased dorsiflexion - left;Decreased dorsiflexion - right  Ambulation Surface Level;Unlevel;Indoor;Outdoor;Paved      04/21/18 1029  Dynamic Gait Index  Level Surface 2  Change in Gait Speed 3  Gait with Horizontal Head Turns 1  Gait with Vertical Head Turns 2  Gait and Pivot Turn 2  Step Over Obstacle 0  Step Around Obstacles 2  Steps 1  Total Score 13  DGI comment: <19 indicates high fall risk   Pt reports compliance with HEP with no issues  on review in session today.      PT Short Term Goals - 04/21/18 1026      PT SHORT TERM GOAL #1   Title  Pt will be IND with HEP to improve deficits listed above. TARGET DATE FOR ALL STGS: goal date extended to 04/19/18 due to pt unable to get in for 2 weeks.     Baseline  04/21/18: met with current HEP    Status  Achieved      PT SHORT TERM GOAL #2   Title  Pt will improve DGI score to >/=14/24 to decr. falls risk.     Baseline  04/21/18: 13/24 scored today, progress just not to goal    Status  Partially Met       PT SHORT TERM GOAL #3   Title  Pt will amb. 300' over even terrain at MOD I level with LRAD to improve functional mobility.     Baseline  04/21/18: met today     Status  Achieved      PT SHORT TERM GOAL #4   Title  Monitor for dizziness and perform vestibular exam as indicated.     Baseline  no goals need at this time.     Status  Achieved          PT Long Term Goals - 03/11/18 1251      PT LONG TERM GOAL #1   Title  Pt will verbalize understanding of fall prevention strategies to improve safety. TARGET DATE FOR ALL LTGS: 05/06/18    Status  New      PT LONG TERM GOAL #2   Title  Pt will improve DGI score to >/=18/24 to decr. falls risk.     Status  New      PT LONG TERM GOAL #3   Title  Pt will amb. 500' over even/uneven terrain with LRAD at MOD I level to improve functional mobility.     Status  New      PT LONG TERM GOAL #4   Title  Pt will improve gait speed with LRAD to >/=3.71f/sec.  to safely amb. in the community.     Status  New          04/21/18 1024  Plan  Clinical Impression Statement Today's skilled session intially focused on progress toward STGs with goals partially met to fully met. Pt does continue to score in a fall risk category with the Dynamic Gait Index (scored 13/24 today, less than 19 indicated high fall risk). Remainder of session continued to address gait with rollator due to this on various surfaces. Pt needs cues on hand position on brakes of rollator and positioning/general safety with rollator. The pt is agreeable to use requesting an order for the rollator from her MD, just not fully agreeable to getting one at this time. She is progressing toward goals slowly and should benefit from continued PT to progress toward unmet goals.                        Pt will benefit from skilled therapeutic intervention in order to improve on the following deficits Abnormal gait;Decreased endurance;Decreased knowledge of use of DME;Decreased strength;Decreased  balance;Decreased mobility;Dizziness;Postural dysfunction;Impaired flexibility  Rehab Potential Good  PT Frequency 2x / week  PT Duration 8 weeks  PT Treatment/Interventions ADLs/Self Care Home Management;Biofeedback;Canalith Repostioning;Therapeutic activities;Therapeutic exercise;Manual techniques;Vestibular;Functional mobility training;Stair training;Orthotic Fit/Training;Gait training;Patient/family education;DME Instruction;Neuromuscular re-education;Balance training  PT Next Visit Plan High level  balance activities and  perform vestibular exam as needed. Gait with rollator. see if MD order has come in.  Consulted and Agree with Plan of Care Patient         Patient will benefit from skilled therapeutic intervention in order to improve the following deficits and impairments:  Abnormal gait, Decreased endurance, Decreased knowledge of use of DME, Decreased strength, Decreased balance, Decreased mobility, Dizziness, Postural dysfunction, Impaired flexibility  Visit Diagnosis: Dizziness and giddiness  Other abnormalities of gait and mobility  Muscle weakness (generalized)     Problem List There are no active problems to display for this patient.   Willow Ora, PTA, [facility] [address], [place], [place] [address] [phone] 04/22/18, 10:28 PM   Name: Rivkah Wolz MRN: [id number] Date of Birth: [birth year]

## 2018-04-23 ENCOUNTER — Ambulatory Visit: Payer: Medicare Other

## 2018-04-23 ENCOUNTER — Telehealth: Payer: Self-pay

## 2018-04-23 DIAGNOSIS — R2689 Other abnormalities of gait and mobility: Secondary | ICD-10-CM | POA: Diagnosis not present

## 2018-04-23 NOTE — Therapy (Signed)
[facility] [address] [place], [place], [address] Phone: [phone]   Fax:  [phone]  Physical Therapy Treatment  Patient Details  Name: Andrea Wong MRN: [id number] Date of Birth: [birth year] Referring Provider: Dr. Alroy Dust   Encounter Date: 04/23/2018  PT End of Session - 04/23/18 1059    Visit Number  9    Number of Visits  17    Date for PT Re-Evaluation  05/10/18    Authorization Type  [employer] Medicare: progress note every 10th visit. No visit limit.    PT Start Time  1016    PT Stop Time  1058    PT Time Calculation (min)  42 min    Equipment Utilized During Treatment  Gait belt    Activity Tolerance  Patient tolerated treatment well    Behavior During Therapy  WFL for tasks assessed/performed       Past Medical History:  Diagnosis Date  . Allergy    seasonal   . Cancer ([facility])    breast CA (1970), Uterine CA (2003)  . Cataract    B cataracts extracted 2019  . Diabetes mellitus without complication ([facility])   . GERD (gastroesophageal reflux disease)   . Hiatal hernia   . Hyperlipidemia   . Hypertension   . Osteopenia     Past Surgical History:  Procedure Laterality Date  . BREAST BIOPSY    . BREAST LUMPECTOMY Left    1970    There were no vitals filed for this visit.  Subjective Assessment - 04/23/18 1019    Subjective  Pt denied falls since last visit. Pt stopped taking Flonase about a week ago, as she was fearful it would cause dizziness despite dizziness being resolved since starting PT. Pt amb. back to gym with her husband's cane.     Patient is accompained by:  Andrea Wong in lobby   Pertinent History  DM, hx of breast and uterine CA (in remission-CA was years ago), osteopenia, recent B cataract extraction ( no improvement with balance), HTN, HLD    Patient Stated Goals  Walk with better balance, walk longer distances for exercises    Currently in Pain?  No/denies                        [facility] Adult PT Treatment/Exercise - 04/23/18 1022      Ambulation/Gait   Ambulation/Gait  Yes    Ambulation/Gait Assistance  4: Min guard    Ambulation/Gait Assistance Details  Cues and demo for sequencing with SPC, min guard to ensure safety.     Ambulation Distance (Feet)  115 Feet   x 4, 150', 100' with SPC and 230', 150' with rollator   Assistive device  Rollator;Straight cane    Gait Pattern  Step-through pattern;Decreased arm swing - left;Decreased arm swing - right;Decreased stride length;Decreased dorsiflexion - left;Decreased dorsiflexion - right    Ambulation Surface  Level;Indoor    Gait velocity  2.78f/sec, with rollator and 1.720fsec. with [facility]        Balance Exercises - 04/23/18 1046      Balance Exercises: Standing   Rockerboard  Anterior/posterior;10 reps;EO;EC;10 seconds;30 seconds;Intermittent UE support    Other Standing Exercises  Performed in // bars with min guard to min A for safety. Cues and demo for technique. Pt required min A to maintain balance intermittently without UE support.         PT Education -  04/23/18 1059    Education Details  PT discussed gait speed and that pt is safest using rollator vs. SPC or no AD. PT noted incr. B ankle edema end of session and encouraged pt to inform MD.     Person(s) Educated  Patient    Methods  Explanation    Comprehension  Verbalized understanding       PT Short Term Goals - 04/21/18 1026      PT SHORT TERM GOAL #1   Title  Pt will be IND with HEP to improve deficits listed above. TARGET DATE FOR ALL STGS: goal date extended to 04/19/18 due to pt unable to get in for 2 weeks.     Baseline  04/21/18: met with current HEP    Status  Achieved      PT SHORT TERM GOAL #2   Title  Pt will improve DGI score to >/=14/24 to decr. falls risk.     Baseline  04/21/18: 13/24 scored today, progress just not to goal    Status  Partially Met      PT SHORT TERM GOAL #3   Title  Pt  will amb. 300' over even terrain at MOD I level with LRAD to improve functional mobility.     Baseline  04/21/18: met today     Status  Achieved      PT SHORT TERM GOAL #4   Title  Monitor for dizziness and perform vestibular exam as indicated.     Baseline  no goals need at this time.     Status  Achieved        PT Long Term Goals - 03/11/18 1251      PT LONG TERM GOAL #1   Title  Pt will verbalize understanding of fall prevention strategies to improve safety. TARGET DATE FOR ALL LTGS: 05/06/18    Status  New      PT LONG TERM GOAL #2   Title  Pt will improve DGI score to >/=18/24 to decr. falls risk.     Status  New      PT LONG TERM GOAL #3   Title  Pt will amb. 500' over even/uneven terrain with LRAD at MOD I level to improve functional mobility.     Status  New      PT LONG TERM GOAL #4   Title  Pt will improve gait speed with LRAD to >/=3.32f/sec.  to safely amb. in the community.     Status  New            Plan - 04/23/18 1059    Clinical Impression Statement  Pt's gait speed with SPC (and LOB) indicate pt is at high falls risk with SPC. Pt able to amb. with less gait deviation, no LOB, and incr. gait speed with rollator. Despite [facility] pt continued to experience LOB and incr. sway with SPC. PT also had pt perform balance activities on rockerboard to improve vestibular input and ankle/hip strategies. Continue with POC.     Rehab Potential  Good    PT Frequency  2x / week    PT Duration  8 weeks    PT Treatment/Interventions  ADLs/Self Care Home Management;Biofeedback;Canalith Repostioning;Therapeutic activities;Therapeutic exercise;Manual techniques;Vestibular;Functional mobility training;Stair training;Orthotic Fit/Training;Gait training;Patient/family education;DME Instruction;Neuromuscular re-education;Balance training    PT Next Visit Plan  High level balance activities and  perform vestibular exam as needed. Gait with rollator. see if MD  order has come in.  Consulted and Agree with Plan of Care  Patient       Patient will benefit from skilled therapeutic intervention in order to improve the following deficits and impairments:  Abnormal gait, Decreased endurance, Decreased knowledge of use of DME, Decreased strength, Decreased balance, Decreased mobility, Dizziness, Postural dysfunction, Impaired flexibility  Visit Diagnosis: Other abnormalities of gait and mobility     Problem List There are no active problems to display for this patient.   Miller,Jennifer L 04/23/2018, 11:02 AM  [facility] [address] [place], [place], [address] Phone: [phone]   Fax:  [phone]  Name: Andrea Wong MRN: [id number] Date of Birth: October 12, 1933  Geoffry Paradise, PT,DPT 04/23/18 11:02 AM Phone: [phone] Fax: [phone]

## 2018-04-23 NOTE — Telephone Encounter (Signed)
Dr. Marlou Sa  I'm currently seeing Ms. Andrea Wong for PT. She demonstrates improved balance, endurance, safety, and less gait deviations when ambulating with rollator. If you agree, please send [place] a prescription for a rollator.  Thank you, Geoffry Paradise, PT,DPT 04/23/18 11:04 AM Phone: [phone] Fax: [phone]  (note faxed to MD, as he's not a part of [facility] system)

## 2018-04-26 ENCOUNTER — Ambulatory Visit: Payer: Medicare Other | Admitting: Physical Therapy

## 2018-04-28 ENCOUNTER — Ambulatory Visit: Payer: Medicare Other

## 2018-04-29 ENCOUNTER — Encounter: Payer: Self-pay | Admitting: Physical Therapy

## 2018-04-29 ENCOUNTER — Ambulatory Visit: Payer: Medicare Other | Admitting: Physical Therapy

## 2018-04-29 DIAGNOSIS — R2689 Other abnormalities of gait and mobility: Secondary | ICD-10-CM | POA: Diagnosis not present

## 2018-04-29 DIAGNOSIS — R42 Dizziness and giddiness: Secondary | ICD-10-CM

## 2018-04-29 DIAGNOSIS — M6281 Muscle weakness (generalized): Secondary | ICD-10-CM

## 2018-04-29 NOTE — Therapy (Signed)
[facility] [address] [place], [place], [address] Phone: [phone]   Fax:  [phone]  Physical Therapy Treatment  Patient Details  Name: Andrea Wong MRN: [id number] Date of Birth: [birth year] Referring Provider: Dr. Alroy Dust   Encounter Date: 04/29/2018  PT End of Session - 04/29/18 0852    Visit Number  10    Number of Visits  17    Date for PT Re-Evaluation  05/10/18    Authorization Type  [employer]HC Medicare: progress note every 10th visit. No visit limit.    PT Start Time  [phone]   pt running late for appt due to traffic   PT Stop Time  0930    PT Time Calculation (min)  40 min    Equipment Utilized During Treatment  Gait belt    Activity Tolerance  Patient tolerated treatment well    Behavior During Therapy  WFL for tasks assessed/performed       Past Medical History:  Diagnosis Date  . Allergy    seasonal   . Cancer ([facility])    breast CA (1970), Uterine CA (2003)  . Cataract    B cataracts extracted 2019  . Diabetes mellitus without complication ([facility])   . GERD (gastroesophageal reflux disease)   . Hiatal hernia   . Hyperlipidemia   . Hypertension   . Osteopenia     Past Surgical History:  Procedure Laterality Date  . BREAST BIOPSY    . BREAST LUMPECTOMY Left    1970    There were no vitals filed for this visit.  Subjective Assessment - 04/29/18 0851    Subjective  No new complaints. No falls or pain to report.     Pertinent History  DM, hx of breast and uterine CA (in remission-CA was years ago), osteopenia, recent B cataract extraction ( no improvement with balance), HTN, HLD    Patient Stated Goals  Walk with better balance, walk longer distances for exercises    Currently in Pain?  No/denies    Pain Score  0-No pain           [facility] Adult PT Treatment/Exercise - 04/29/18 0853      Transfers   Transfers  Sit to Stand;Stand to Sit    Sit to Stand  5: Supervision;With upper  extremity assist;From chair/3-in-1    Stand to Sit  5: Supervision;With upper extremity assist;To chair/3-in-1      Ambulation/Gait   Ambulation/Gait  Yes    Ambulation/Gait Assistance  5: Supervision    Ambulation/Gait Assistance Details  reminder cues needed for hand placement on rollator and for proximity with gait.     Ambulation Distance (Feet)  1000 Feet   x1   Assistive device  Rollator;Straight cane    Gait Pattern  Step-through pattern;Decreased arm swing - left;Decreased arm swing - right;Decreased stride length;Decreased dorsiflexion - left;Decreased dorsiflexion - right    Ambulation Surface  Level;Unlevel;Indoor;Outdoor;Paved      High Level Balance   High Level Balance Activities  Side stepping;Marching forwards;Marching backwards;Tandem walking   tandem fwd/bwd   High Level Balance Comments  on both red mats next to counter with light UE support/min guard to min assist for balance: performed 3 laps each with cues on posture, weight shifting and ex form/technique.           Balance Exercises - 04/29/18 0916      Balance Exercises: Standing   Rockerboard  Anterior/posterior;Lateral;Head turns;EO;EC;30 seconds;10 reps  Balance Exercises: Standing   Rebounder Limitations  performed both ways on balance board with no UE support: rocking the board with emphasis on tall posture with EO; progressing to holding the board steady with EC no head movements, progressing to EC head movements left<>right, then up<>down. min guard to min assist for balance with cues on posture and weight shifitng to assist with balance.            PT Short Term Goals - 04/21/18 1026      PT SHORT TERM GOAL #1   Title  Pt will be IND with HEP to improve deficits listed above. TARGET DATE FOR ALL STGS: goal date extended to 04/19/18 due to pt unable to get in for 2 weeks.     Baseline  04/21/18: met with current HEP    Status  Achieved      PT SHORT TERM GOAL #2   Title  Pt will improve DGI  score to >/=14/24 to decr. falls risk.     Baseline  04/21/18: 13/24 scored today, progress just not to goal    Status  Partially Met      PT SHORT TERM GOAL #3   Title  Pt will amb. 300' over even terrain at MOD I level with LRAD to improve functional mobility.     Baseline  04/21/18: met today     Status  Achieved      PT SHORT TERM GOAL #4   Title  Monitor for dizziness and perform vestibular exam as indicated.     Baseline  no goals need at this time.     Status  Achieved        PT Long Term Goals - 03/11/18 1251      PT LONG TERM GOAL #1   Title  Pt will verbalize understanding of fall prevention strategies to improve safety. TARGET DATE FOR ALL LTGS: 05/06/18    Status  New      PT LONG TERM GOAL #2   Title  Pt will improve DGI score to >/=18/24 to decr. falls risk.     Status  New      PT LONG TERM GOAL #3   Title  Pt will amb. 500' over even/uneven terrain with LRAD at MOD I level to improve functional mobility.     Status  New      PT LONG TERM GOAL #4   Title  Pt will improve gait speed with LRAD to >/=3.9f/sec.  to safely amb. in the community.     Status  New            Plan - 04/29/18 09379   Clinical Impression Statement  Today's skilled session initially focused on use of rollator on outdoor surfaces with cues needed on hand placement on brakes for safety. Remainder of session focused on balance reaction training with use of straight cane between activities with up to min assist for balance. The pt is progressing toward goals and should benefit from continued PT to progress toward unmet goals.                      Rehab Potential  Good    PT Frequency  2x / week    PT Duration  8 weeks    PT Treatment/Interventions  ADLs/Self Care Home Management;Biofeedback;Canalith Repostioning;Therapeutic activities;Therapeutic exercise;Manual techniques;Vestibular;Functional mobility training;Stair training;Orthotic Fit/Training;Gait training;Patient/family  education;DME Instruction;Neuromuscular re-education;Balance training    PT Next Visit Plan  continue to work  on balance activities, gait with rollator on outdoor surfaces/ramps/curbs  (see of MD order has come in and provide to pt)    Consulted and Agree with Plan of Care  Patient       Patient will benefit from skilled therapeutic intervention in order to improve the following deficits and impairments:  Abnormal gait, Decreased endurance, Decreased knowledge of use of DME, Decreased strength, Decreased balance, Decreased mobility, Dizziness, Postural dysfunction, Impaired flexibility  Visit Diagnosis: Other abnormalities of gait and mobility  Dizziness and giddiness  Muscle weakness (generalized)     Problem List There are no active problems to display for this patient.   Willow Ora, PTA, [facility] [address] [place], [place] [address] [phone] 04/29/18, 10:35 PM   Name: Andrea Wong MRN: [id number] Date of Birth: 03-13-1934

## 2018-04-30 ENCOUNTER — Ambulatory Visit: Payer: Medicare Other

## 2018-04-30 DIAGNOSIS — R42 Dizziness and giddiness: Secondary | ICD-10-CM

## 2018-04-30 DIAGNOSIS — R2689 Other abnormalities of gait and mobility: Secondary | ICD-10-CM | POA: Diagnosis not present

## 2018-04-30 DIAGNOSIS — M6281 Muscle weakness (generalized): Secondary | ICD-10-CM

## 2018-04-30 NOTE — Therapy (Signed)
[facility] [address] [place], [place], [address] Phone: [phone]   Fax:  [phone]  Physical Therapy Treatment  Patient Details  Name: Canda Podgorski MRN: [id number] Date of Birth: 1934/06/22 Referring Provider: Dr. Alroy Dust   Encounter Date: 04/30/2018  PT End of Session - 04/30/18 1452    Visit Number  11    Number of Visits  17    Date for PT Re-Evaluation  05/10/18    Authorization Type  [employer]HC Medicare: progress note every 10th visit. No visit limit.    PT Start Time  1450    PT Stop Time  1530    PT Time Calculation (min)  40 min    Equipment Utilized During Treatment  Gait belt    Activity Tolerance  Patient tolerated treatment well    Behavior During Therapy  WFL for tasks assessed/performed       Past Medical History:  Diagnosis Date  . Allergy    seasonal   . Cancer ([facility])    breast CA (1970), Uterine CA (2003)  . Cataract    B cataracts extracted 2019  . Diabetes mellitus without complication ([place])   . GERD (gastroesophageal reflux disease)   . Hiatal hernia   . Hyperlipidemia   . Hypertension   . Osteopenia     Past Surgical History:  Procedure Laterality Date  . BREAST BIOPSY    . BREAST LUMPECTOMY Left    1970    There were no vitals filed for this visit.  Subjective Assessment - 04/30/18 1453    Subjective  No new complaints, no falls to report.     Pertinent History  DM, hx of breast and uterine CA (in remission-CA was years ago), osteopenia, recent B cataract extraction ( no improvement with balance), HTN, HLD    Patient Stated Goals  Walk with better balance, walk longer distances for exercises    Currently in Pain?  Yes    Pain Score  5     Pain Location  Shoulder    Pain Orientation  Right    Pain Descriptors / Indicators  Aching    Pain Type  Acute pain    Pain Onset  1 to 4 weeks ago    Aggravating Factors   Using RUE    Pain Relieving Factors  Resting RUE         [facility] Adult PT Treatment/Exercise - 04/30/18 1535      Ambulation/Gait   Ambulation/Gait  Yes    Ambulation/Gait Assistance  5: Supervision    Ambulation/Gait Assistance Details  VC's for distance from rollator and proper use of AD.    Ambulation Distance (Feet)  1000 Feet    Assistive device  Rollator;Straight cane    Gait Pattern  Step-through pattern;Decreased arm swing - left;Decreased arm swing - right;Decreased stride length;Decreased dorsiflexion - left;Decreased dorsiflexion - right    Ambulation Surface  Level;Unlevel;Indoor;Outdoor;Paved    Ramp  5: Supervision    Ramp Details (indicate cue type and reason)  outdoors with Rollator, VC's for weight shift and pacing.     Curb  4: Min assist    Curb Details (indicate cue type and reason)  Outdoors with Rollator, VC's for initial instruction and technique, and proper use of AD.       High Level Balance   High Level Balance Activities  Side stepping;Marching forwards;Marching backwards;Tandem walking;Backward walking    High Level Balance Comments  On red mat next to counter  with SUE required, Min guard for safety x3 laps each, side stepping over cones, stepping over small orange hurdles, VC's for posture, looking forward, technique.       Neuro Re-ed    Neuro Re-ed Details   Pt in hallway with SPC ambulating while performing head nods, head turns, diagonals, sudden stop/turn, and fast/slow with min guard, one episode of LOB with diagonals with pt able to correct, VC's for posture, narrow BOS, step length and pacing.          PT Short Term Goals - 04/30/18 1557      PT SHORT TERM GOAL #1   Title  Pt will be IND with HEP to improve deficits listed above. TARGET DATE FOR ALL STGS: goal date extended to 04/19/18 due to pt unable to get in for 2 weeks.     Baseline  04/21/18: met with current HEP    Status  Achieved      PT SHORT TERM GOAL #2   Title  Pt will improve DGI score to >/=14/24 to decr. falls risk.     Baseline   04/21/18: 13/24 scored today, progress just not to goal    Status  Partially Met      PT SHORT TERM GOAL #3   Title  Pt will amb. 300' over even terrain at MOD I level with LRAD to improve functional mobility.     Baseline  04/21/18: met today     Status  Achieved      PT SHORT TERM GOAL #4   Title  Monitor for dizziness and perform vestibular exam as indicated.     Baseline  no goals need at this time.     Status  Achieved        PT Long Term Goals - 04/30/18 1557      PT LONG TERM GOAL #1   Title  Pt will verbalize understanding of fall prevention strategies to improve safety. TARGET DATE FOR ALL LTGS: 05/06/18    Status  New      PT LONG TERM GOAL #2   Title  Pt will improve DGI score to >/=18/24 to decr. falls risk.     Status  New      PT LONG TERM GOAL #3   Title  Pt will amb. 500' over even/uneven terrain with LRAD at MOD I level to improve functional mobility.     Status  New      PT LONG TERM GOAL #4   Title  Pt will improve gait speed with LRAD to >/=3.42f/sec.  to safely amb. in the community.     Status  New        Plan - 04/30/18 1551    Clinical Impression Statement  Todays skilled session focused on gait outdoors with rollator walker while negotiating curbs/ramps, high level balance training on compliant surfaces and gait indoors with SPC performing head movements/sudden changes. Pt is progressing towards goals and should benefit from PT to continue to progress towards goals.     Rehab Potential  Good    PT Frequency  2x / week    PT Duration  8 weeks    PT Treatment/Interventions  ADLs/Self Care Home Management;Biofeedback;Canalith Repostioning;Therapeutic activities;Therapeutic exercise;Manual techniques;Vestibular;Functional mobility training;Stair training;Orthotic Fit/Training;Gait training;Patient/family education;DME Instruction;Neuromuscular re-education;Balance training    PT Next Visit Plan  continue to work on balance activities, gait with rollator  on outdoor surfaces/ramps/curbs  (see of MD order has come in and provide to pt)  Consulted and Agree with Plan of Care  Patient       Patient will benefit from skilled therapeutic intervention in order to improve the following deficits and impairments:  Abnormal gait, Decreased endurance, Decreased knowledge of use of DME, Decreased strength, Decreased balance, Decreased mobility, Dizziness, Postural dysfunction, Impaired flexibility  Visit Diagnosis: Other abnormalities of gait and mobility  Dizziness and giddiness  Muscle weakness (generalized)     Problem List There are no active problems to display for this patient.  Chassity Felts, PTA  Chassity A Felts 04/30/2018, 4:06 PM  [facility] [address] [place], [place], [address] Phone: [phone]   Fax:  [phone]  Name: Monserrate Blaschke MRN: [id number] Date of Birth: 13-Mar-1934

## 2018-05-05 ENCOUNTER — Ambulatory Visit: Payer: Medicare Other | Admitting: Physical Therapy

## 2018-05-05 DIAGNOSIS — R2689 Other abnormalities of gait and mobility: Secondary | ICD-10-CM

## 2018-05-05 DIAGNOSIS — R42 Dizziness and giddiness: Secondary | ICD-10-CM

## 2018-05-05 DIAGNOSIS — M6281 Muscle weakness (generalized): Secondary | ICD-10-CM

## 2018-05-05 NOTE — Therapy (Signed)
[facility] [address] [place], [place], [address] Phone: [phone]   Fax:  [phone]  Physical Therapy Treatment  Patient Details  Name: Andrea Wong MRN: [id number] Date of Birth: 1933-11-30 Referring Provider: Dr. Alroy Dust   Encounter Date: 05/05/2018  PT End of Session - 05/05/18 1027    Visit Number  12    Number of Visits  17    Date for PT Re-Evaluation  05/10/18    Authorization Type  [employer] Medicare: progress note every 10th visit. No visit limit.    PT Start Time  1021    PT Stop Time  1100    PT Time Calculation (min)  39 min    Activity Tolerance  Patient tolerated treatment well    Behavior During Therapy  WFL for tasks assessed/performed       Past Medical History:  Diagnosis Date  . Allergy    seasonal   . Cancer ([place])    breast CA (1970), Uterine CA (2003)  . Cataract    B cataracts extracted 2019  . Diabetes mellitus without complication ([place])   . GERD (gastroesophageal reflux disease)   . Hiatal hernia   . Hyperlipidemia   . Hypertension   . Osteopenia     Past Surgical History:  Procedure Laterality Date  . BREAST BIOPSY    . BREAST LUMPECTOMY Left    1970    There were no vitals filed for this visit.  Subjective Assessment - 05/05/18 1026    Subjective  wants to cancel tomorrow due to appt being at 8 am - offered her a 2pm appt time with patient declining. patient frustrated that she needs to continue to ambulate with rollator.    Patient is accompained by:  Family member   husband   Pertinent History  DM, hx of breast and uterine CA (in remission-CA was years ago), osteopenia, recent B cataract extraction ( no improvement with balance), HTN, HLD    Patient Stated Goals  Walk with better balance, walk longer distances for exercises    Currently in Pain?  Yes    Pain Score  5     Pain Location  Shoulder    Pain Orientation  Right    Pain Descriptors / Indicators   Aching;Sore    Pain Type  Acute pain    Pain Frequency  Intermittent                       [facility] Adult PT Treatment/Exercise - 05/05/18 0001      Ambulation/Gait   Ambulation/Gait  Yes    Ambulation/Gait Assistance  5: Supervision    Ambulation/Gait Assistance Details  supervision/min guard level level on outdoor surfaces with verbal cueing for safety and brake management    Ambulation Distance (Feet)  1000 Feet    Assistive device  Rollator    Gait Pattern  Step-through pattern;Decreased arm swing - left;Decreased arm swing - right;Decreased stride length;Decreased dorsiflexion - left;Decreased dorsiflexion - right    Ambulation Surface  Level;Indoor;Outdoor;Paved;Grass    Gait velocity  2.3 ft/sec with rollator    Curb  4: Min assist    Curb Details (indicate cue type and reason)  outdoors up/down 1 curb; cueing throughout for safety      Standardized Balance Assessment   Standardized Balance Assessment  Dynamic Gait Index      Dynamic Gait Index   Level Surface  Mild Impairment    Change in  Gait Speed  Mild Impairment    Gait with Horizontal Head Turns  Moderate Impairment    Gait with Vertical Head Turns  Mild Impairment    Gait and Pivot Turn  Mild Impairment    Step Over Obstacle  Severe Impairment    Step Around Obstacles  Mild Impairment    Steps  Mild Impairment    Total Score  13             PT Education - 05/05/18 1118    Education Details  PT educating patient on need for rollator with patient - discussed justification as patient was unable to perform static stance without reaching for PT. Given referral for rollator from MD as well as spent time discussing where to obtain device with list of medical equipment stores in the area. Discussion on continued need for use of rollator as well as compliance with HEP for full benefit and carryover into the home and community.     Person(s) Educated  Patient    Methods  Explanation    Comprehension   Verbalized understanding       PT Short Term Goals - 04/30/18 1557      PT SHORT TERM GOAL #1   Title  Pt will be IND with HEP to improve deficits listed above. TARGET DATE FOR ALL STGS: goal date extended to 04/19/18 due to pt unable to get in for 2 weeks.     Baseline  04/21/18: met with current HEP    Status  Achieved      PT SHORT TERM GOAL #2   Title  Pt will improve DGI score to >/=14/24 to decr. falls risk.     Baseline  04/21/18: 13/24 scored today, progress just not to goal    Status  Partially Met      PT SHORT TERM GOAL #3   Title  Pt will amb. 300' over even terrain at MOD I level with LRAD to improve functional mobility.     Baseline  04/21/18: met today     Status  Achieved      PT SHORT TERM GOAL #4   Title  Monitor for dizziness and perform vestibular exam as indicated.     Baseline  no goals need at this time.     Status  Achieved        PT Long Term Goals - 05/05/18 1121      PT LONG TERM GOAL #1   Title  Pt will verbalize understanding of fall prevention strategies to improve safety. TARGET DATE FOR ALL LTGS: 05/06/18    Baseline  requires continued education on brake management with rollator    Status  Partially Met      PT LONG TERM GOAL #2   Title  Pt will improve DGI score to >/=18/24 to decr. falls risk.     Baseline  see above    Status  Not Met      PT LONG TERM GOAL #3   Title  Pt will amb. 500' over even/uneven terrain with LRAD at MOD I level to improve functional mobility.     Baseline  able to ambulate progressive distances over various levels and surfaces but continues to require Min guard as well as verbal cueing    Status  Partially Met      PT LONG TERM GOAL #4   Title  Pt will improve gait speed with LRAD to >/=3.41f/sec.  to safely amb. in the community.  Baseline  see above    Status  Not Met            Plan - 05/05/18 1122    Clinical Impression Statement  Patient presenting to [facility] today with husband rollator. PT  providing patient referral from MD for rollator today as wall as handout of local medical equipment stores. Patient stating she wishes to cancel appt for tomorrow and not schedule any further at this time. Patient LTGs due with PT assessing - did not meet gait speed, DGI, or Mod I gait goals at this time as patient requires use of device as well as cueing throughout for safety. Much time spent today on education of brakes with device as patient demonstrated poor carryover between tasks. Patient electing to hold on PT currently with education on allowance of grace period (30 days). Will plan on 30 day hold currently, but with PT feeling as if patient would continue to benefit from skilled intervention to improve balance and fall risk.     Rehab Potential  Good    PT Frequency  2x / week    PT Duration  8 weeks    PT Treatment/Interventions  ADLs/Self Care Home Management;Biofeedback;Canalith Repostioning;Therapeutic activities;Therapeutic exercise;Manual techniques;Vestibular;Functional mobility training;Stair training;Orthotic Fit/Training;Gait training;Patient/family education;DME Instruction;Neuromuscular re-education;Balance training    PT Next Visit Plan  30 day hold?    Consulted and Agree with Plan of Care  Patient       Patient will benefit from skilled therapeutic intervention in order to improve the following deficits and impairments:  Abnormal gait, Decreased endurance, Decreased knowledge of use of DME, Decreased strength, Decreased balance, Decreased mobility, Dizziness, Postural dysfunction, Impaired flexibility  Visit Diagnosis: Other abnormalities of gait and mobility  Dizziness and giddiness  Muscle weakness (generalized)     Problem List There are no active problems to display for this patient.   Lanney Gins, PT, DPT Supplemental Physical Therapist 05/05/18 11:26 AM Pager: [phone] Office: [facility] [address] [place], [place], [address] Phone: [phone]   Fax:  [phone]  Name: Brooklyne Radke MRN: [id number] Date of Birth: May 06, 1934

## 2018-05-06 ENCOUNTER — Ambulatory Visit: Payer: Medicare Other | Admitting: Physical Therapy

## 2019-06-23 ENCOUNTER — Ambulatory Visit
Admission: RE | Admit: 2019-06-23 | Discharge: 2019-06-23 | Disposition: A | Discharge status: Home / Self Care | Payer: Medicare Other | Source: Ambulatory Visit | Attending: Family Medicine | Admitting: Family Medicine

## 2019-06-23 ENCOUNTER — Other Ambulatory Visit: Payer: Self-pay | Admitting: Family Medicine

## 2019-06-23 DIAGNOSIS — R059 Cough, unspecified: Secondary | ICD-10-CM

## 2019-06-23 DIAGNOSIS — R05 Cough: Secondary | ICD-10-CM

## 2019-10-06 ENCOUNTER — Ambulatory Visit: Payer: Medicare PPO | Attending: Internal Medicine

## 2019-10-06 DIAGNOSIS — Z23 Encounter for immunization: Secondary | ICD-10-CM | POA: Insufficient documentation

## 2019-10-06 NOTE — Progress Notes (Signed)
   Covid-19 Vaccination Clinic  Name:  Andrea Wong    MRN: [id number] DOB: May 14, 1934  10/06/2019  [name] was observed post Covid-19 immunization for 15 minutes without incidence. She was provided with Vaccine Information Sheet and instruction to access the V-Safe system.   [name] was instructed to call 911 with any severe reactions post vaccine: Marland Kitchen Difficulty breathing  . Swelling of your face and throat  . A fast heartbeat  . A bad rash all over your body  . Dizziness and weakness    Immunizations Administered    Name Date Dose VIS Date Route   Pfizer COVID-19 Vaccine 10/06/2019  1:30 PM 0.3 mL 07/22/2019 Intramuscular   Manufacturer: [place]   Lot: [id number]   [place]: [id number]

## 2019-10-11 ENCOUNTER — Other Ambulatory Visit: Payer: Self-pay | Admitting: Family Medicine

## 2019-10-11 DIAGNOSIS — Z1231 Encounter for screening mammogram for malignant neoplasm of breast: Secondary | ICD-10-CM

## 2019-10-13 ENCOUNTER — Other Ambulatory Visit: Payer: Self-pay | Admitting: Family Medicine

## 2019-10-13 DIAGNOSIS — M8580 Other specified disorders of bone density and structure, unspecified site: Secondary | ICD-10-CM

## 2019-11-01 ENCOUNTER — Ambulatory Visit: Payer: Medicare PPO | Attending: Internal Medicine

## 2019-11-01 DIAGNOSIS — Z23 Encounter for immunization: Secondary | ICD-10-CM

## 2019-11-01 NOTE — Progress Notes (Signed)
   Covid-19 Vaccination Clinic  Name:  Andrea Wong    MRN: [id number] DOB: April 02, 1934  11/01/2019  [name] was observed post Covid-19 immunization for 15 minutes without incident. She was provided with Vaccine Information Sheet and instruction to access the V-Safe system.   [name] was instructed to call 911 with any severe reactions post vaccine: Marland Kitchen Difficulty breathing  . Swelling of face and throat  . A fast heartbeat  . A bad rash all over body  . Dizziness and weakness   Immunizations Administered    Name Date Dose VIS Date Route   Pfizer COVID-19 Vaccine 11/01/2019  9:03 AM 0.3 mL 07/22/2019 Intramuscular   Manufacturer: [place]   Lot: [id number]   [place]: [id number]

## 2019-11-09 ENCOUNTER — Ambulatory Visit: Payer: Medicare PPO

## 2019-11-24 DIAGNOSIS — R2689 Other abnormalities of gait and mobility: Secondary | ICD-10-CM | POA: Diagnosis not present

## 2019-11-24 DIAGNOSIS — K219 Gastro-esophageal reflux disease without esophagitis: Secondary | ICD-10-CM | POA: Diagnosis not present

## 2019-11-24 DIAGNOSIS — E7800 Pure hypercholesterolemia, unspecified: Secondary | ICD-10-CM | POA: Diagnosis not present

## 2019-11-24 DIAGNOSIS — J309 Allergic rhinitis, unspecified: Secondary | ICD-10-CM | POA: Diagnosis not present

## 2019-11-24 DIAGNOSIS — Z0000 Encounter for general adult medical examination without abnormal findings: Secondary | ICD-10-CM | POA: Diagnosis not present

## 2019-11-24 DIAGNOSIS — E1169 Type 2 diabetes mellitus with other specified complication: Secondary | ICD-10-CM | POA: Diagnosis not present

## 2019-11-24 DIAGNOSIS — I10 Essential (primary) hypertension: Secondary | ICD-10-CM | POA: Diagnosis not present

## 2019-11-24 DIAGNOSIS — Z23 Encounter for immunization: Secondary | ICD-10-CM | POA: Diagnosis not present

## 2019-11-24 DIAGNOSIS — M8580 Other specified disorders of bone density and structure, unspecified site: Secondary | ICD-10-CM | POA: Diagnosis not present

## 2019-12-27 ENCOUNTER — Ambulatory Visit
Admission: RE | Admit: 2019-12-27 | Discharge: 2019-12-27 | Disposition: A | Discharge status: Home / Self Care | Payer: Medicare PPO | Source: Ambulatory Visit | Attending: Family Medicine | Admitting: Family Medicine

## 2019-12-27 ENCOUNTER — Other Ambulatory Visit: Payer: Self-pay

## 2019-12-27 ENCOUNTER — Other Ambulatory Visit: Payer: Medicare PPO

## 2019-12-27 DIAGNOSIS — Z780 Asymptomatic menopausal state: Secondary | ICD-10-CM | POA: Diagnosis not present

## 2019-12-27 DIAGNOSIS — Z1231 Encounter for screening mammogram for malignant neoplasm of breast: Secondary | ICD-10-CM | POA: Diagnosis not present

## 2019-12-27 DIAGNOSIS — M8589 Other specified disorders of bone density and structure, multiple sites: Secondary | ICD-10-CM | POA: Diagnosis not present

## 2019-12-27 DIAGNOSIS — M8580 Other specified disorders of bone density and structure, unspecified site: Secondary | ICD-10-CM

## 2019-12-29 ENCOUNTER — Other Ambulatory Visit: Payer: Self-pay | Admitting: Family Medicine

## 2019-12-29 DIAGNOSIS — R928 Other abnormal and inconclusive findings on diagnostic imaging of breast: Secondary | ICD-10-CM

## 2020-01-03 ENCOUNTER — Encounter: Payer: Self-pay | Admitting: Orthopaedic Surgery

## 2020-01-03 ENCOUNTER — Ambulatory Visit (INDEPENDENT_AMBULATORY_CARE_PROVIDER_SITE_OTHER): Payer: Medicare PPO

## 2020-01-03 ENCOUNTER — Ambulatory Visit: Payer: Medicare PPO | Admitting: Orthopaedic Surgery

## 2020-01-03 ENCOUNTER — Other Ambulatory Visit: Payer: Self-pay

## 2020-01-03 ENCOUNTER — Ambulatory Visit: Payer: Medicare PPO

## 2020-01-03 ENCOUNTER — Ambulatory Visit
Admission: RE | Admit: 2020-01-03 | Discharge: 2020-01-03 | Disposition: A | Discharge status: Home / Self Care | Payer: Medicare PPO | Source: Ambulatory Visit | Attending: Family Medicine | Admitting: Family Medicine

## 2020-01-03 ENCOUNTER — Ambulatory Visit: Payer: Self-pay

## 2020-01-03 DIAGNOSIS — R928 Other abnormal and inconclusive findings on diagnostic imaging of breast: Secondary | ICD-10-CM | POA: Diagnosis not present

## 2020-01-03 DIAGNOSIS — M170 Bilateral primary osteoarthritis of knee: Secondary | ICD-10-CM | POA: Diagnosis not present

## 2020-01-03 DIAGNOSIS — M1712 Unilateral primary osteoarthritis, left knee: Secondary | ICD-10-CM | POA: Insufficient documentation

## 2020-01-03 DIAGNOSIS — M1711 Unilateral primary osteoarthritis, right knee: Secondary | ICD-10-CM

## 2020-01-03 DIAGNOSIS — Z853 Personal history of malignant neoplasm of breast: Secondary | ICD-10-CM | POA: Diagnosis not present

## 2020-01-03 MED ORDER — MELOXICAM 7.5 MG PO TABS: 7.5000 mg | ORAL_TABLET | Freq: Two times a day (BID) | ORAL | 2 refills | Status: DC | PRN

## 2020-01-03 NOTE — Progress Notes (Signed)
Office Visit Note   Patient: Andrea Wong           Date of Birth: 08-Oct-1933           MRN: [id number] Visit Date: 01/03/2020              Requested by: Alroy Dust, L.Marlou Sa, [place] [address] [place],  [place] [address] PCP: Alroy Dust, L.Marlou Sa, MD   Assessment & Plan: Visit Diagnoses:  1. Primary osteoarthritis of right knee   2. Primary osteoarthritis of left knee     Plan: Impression is bilateral knee DJD.  We had a lengthy discussion on multiple treatment options and based on our discussion she would like to try a prescription for meloxicam as well as Voltaren gel and some home exercises for strengthening.  She will hold off on the injections until gets worse.  Follow-up as needed.  Follow-Up Instructions: Return if symptoms worsen or fail to improve.   Orders:  Orders Placed This Encounter  Procedures  . XR KNEE 3 VIEW RIGHT  . XR KNEE 3 VIEW LEFT   Meds ordered this encounter  Medications  . meloxicam (MOBIC) 7.5 MG tablet    Sig: Take 1 tablet (7.5 mg total) by mouth 2 (two) times daily as needed for pain.    Dispense:  30 tablet    Refill:  2      Procedures: No procedures performed   Clinical Data: No additional findings.   Subjective: Chief Complaint  Patient presents with  . Left Knee - Pain  . Right Knee - Pain    Andrea Wong is a 86 year old female comes in with her husband for evaluation of bilateral knee pain.  She states that the knee pain is achy and chronic and painful with general movement.  Denies any sharp stabbing mechanical pain.  She does feel that sometimes her knees want to give way and she has trouble with balance.  She did a balance class which did not help.  She has not had any previous surgeries or injections to the knees.  She ambulates with a cane.  She was told 20 years ago that she had a meniscal injury but did not undergo any surgical intervention.  She denies any swelling or numbness and tingling.   Review of Systems    Constitutional: Negative.   HENT: Negative.   Eyes: Negative.   Respiratory: Negative.   Cardiovascular: Negative.   Endocrine: Negative.   Musculoskeletal: Negative.   Neurological: Negative.   Hematological: Negative.   Psychiatric/Behavioral: Negative.   All other systems reviewed and are negative.    Objective: Vital Signs: There were no vitals taken for this visit.  Physical Exam Vitals and nursing note reviewed.  Constitutional:      Appearance: She is well-developed.  HENT:     Head: Normocephalic and atraumatic.  Pulmonary:     Effort: Pulmonary effort is normal.  Abdominal:     Palpations: Abdomen is soft.  Musculoskeletal:     Cervical back: Neck supple.  Skin:    General: Skin is warm.     Capillary Refill: Capillary refill takes less than 2 seconds.  Neurological:     Mental Status: She is alert and oriented to person, place, and time.  Psychiatric:        Behavior: Behavior normal.        Thought Content: Thought content normal.        Judgment: Judgment normal.     Ortho Exam Bilateral  knee exams show no joint effusion.  Normal range of motion.  Collaterals and cruciates are stable.  Patellofemoral crepitus. Specialty Comments:  No specialty comments available.  Imaging: MM DIAG BREAST TOMO UNI RIGHT  Result Date: 01/03/2020 CLINICAL DATA:  86 year old female recalled from screening mammogram dated 12/27/2019 for a possible right breast mass. The patient is status post left mastectomy. EXAM: DIGITAL DIAGNOSTIC UNILATERAL RIGHT MAMMOGRAM WITH CAD AND TOMO COMPARISON:  Previous exam(s). ACR Breast Density Category b: There are scattered areas of fibroglandular density. FINDINGS: Previously described, possible mass in the subareolar right breast resolves into well dispersed fatty and fibroglandular tissue on today's additional views. The parenchymal pattern is stable from prior mammograms. No suspicious findings are identified. Mammographic images were  processed with CAD. IMPRESSION: No mammographic evidence of malignancy. RECOMMENDATION: Routine annual screening in 1 year. I have discussed the findings and recommendations with the patient. If applicable, a reminder letter will be sent to the patient regarding the next appointment. BI-RADS CATEGORY  1: Negative. Electronically Signed   By: Kristopher Oppenheim M.D.   On: 01/03/2020 14:42   XR KNEE 3 VIEW LEFT  Result Date: 01/03/2020 Moderate tricompartmental DJD.  XR KNEE 3 VIEW RIGHT  Result Date: 01/03/2020 Moderately severe tricompartmental DJD worse in the lateral compartment    PMFS History: Patient Active Problem List   Diagnosis Date Noted  . Primary osteoarthritis of left knee 01/03/2020  . Primary osteoarthritis of right knee 01/03/2020   Past Medical History:  Diagnosis Date  . Allergy    seasonal   . Cancer ([place])    breast CA (1970), Uterine CA (2003)  . Cataract    B cataracts extracted 2019  . Diabetes mellitus without complication ([place])   . GERD (gastroesophageal reflux disease)   . Hiatal hernia   . Hyperlipidemia   . Hypertension   . Osteopenia     History reviewed. No pertinent family history.  Past Surgical History:  Procedure Laterality Date  . BREAST BIOPSY    . BREAST LUMPECTOMY Left    1970   Social History   Occupational History  . Not on file  Tobacco Use  . Smoking status: Never Smoker  . Smokeless tobacco: Never Used  Substance and Sexual Activity  . Alcohol use: Yes    Alcohol/week: 2.0 standard drinks    Types: 2 Glasses of wine per week  . Drug use: Never  . Sexual activity: Not on file

## 2020-05-25 DIAGNOSIS — M8580 Other specified disorders of bone density and structure, unspecified site: Secondary | ICD-10-CM | POA: Diagnosis not present

## 2020-05-25 DIAGNOSIS — E1169 Type 2 diabetes mellitus with other specified complication: Secondary | ICD-10-CM | POA: Diagnosis not present

## 2020-05-25 DIAGNOSIS — J309 Allergic rhinitis, unspecified: Secondary | ICD-10-CM | POA: Diagnosis not present

## 2020-05-25 DIAGNOSIS — M25569 Pain in unspecified knee: Secondary | ICD-10-CM | POA: Diagnosis not present

## 2020-05-25 DIAGNOSIS — I10 Essential (primary) hypertension: Secondary | ICD-10-CM | POA: Diagnosis not present

## 2020-05-25 DIAGNOSIS — R2689 Other abnormalities of gait and mobility: Secondary | ICD-10-CM | POA: Diagnosis not present

## 2020-05-25 DIAGNOSIS — E7800 Pure hypercholesterolemia, unspecified: Secondary | ICD-10-CM | POA: Diagnosis not present

## 2020-05-25 DIAGNOSIS — Z23 Encounter for immunization: Secondary | ICD-10-CM | POA: Diagnosis not present

## 2020-05-25 DIAGNOSIS — K219 Gastro-esophageal reflux disease without esophagitis: Secondary | ICD-10-CM | POA: Diagnosis not present

## 2020-08-19 IMAGING — MG MM DIGITAL DIAGNOSTIC UNILAT*R* W/ TOMO W/ CAD
4 series · 4 of 12 positions shown · non-contrast
Comparison: Previous exam(s).

CLINICAL DATA: 85-year-old female recalled from screening mammogram
dated 12/27/2019 for a possible right breast mass. The patient is
status post left mastectomy.

EXAM:
DIGITAL DIAGNOSTIC UNILATERAL RIGHT MAMMOGRAM WITH CAD AND TOMO

[R CC synth-2D]
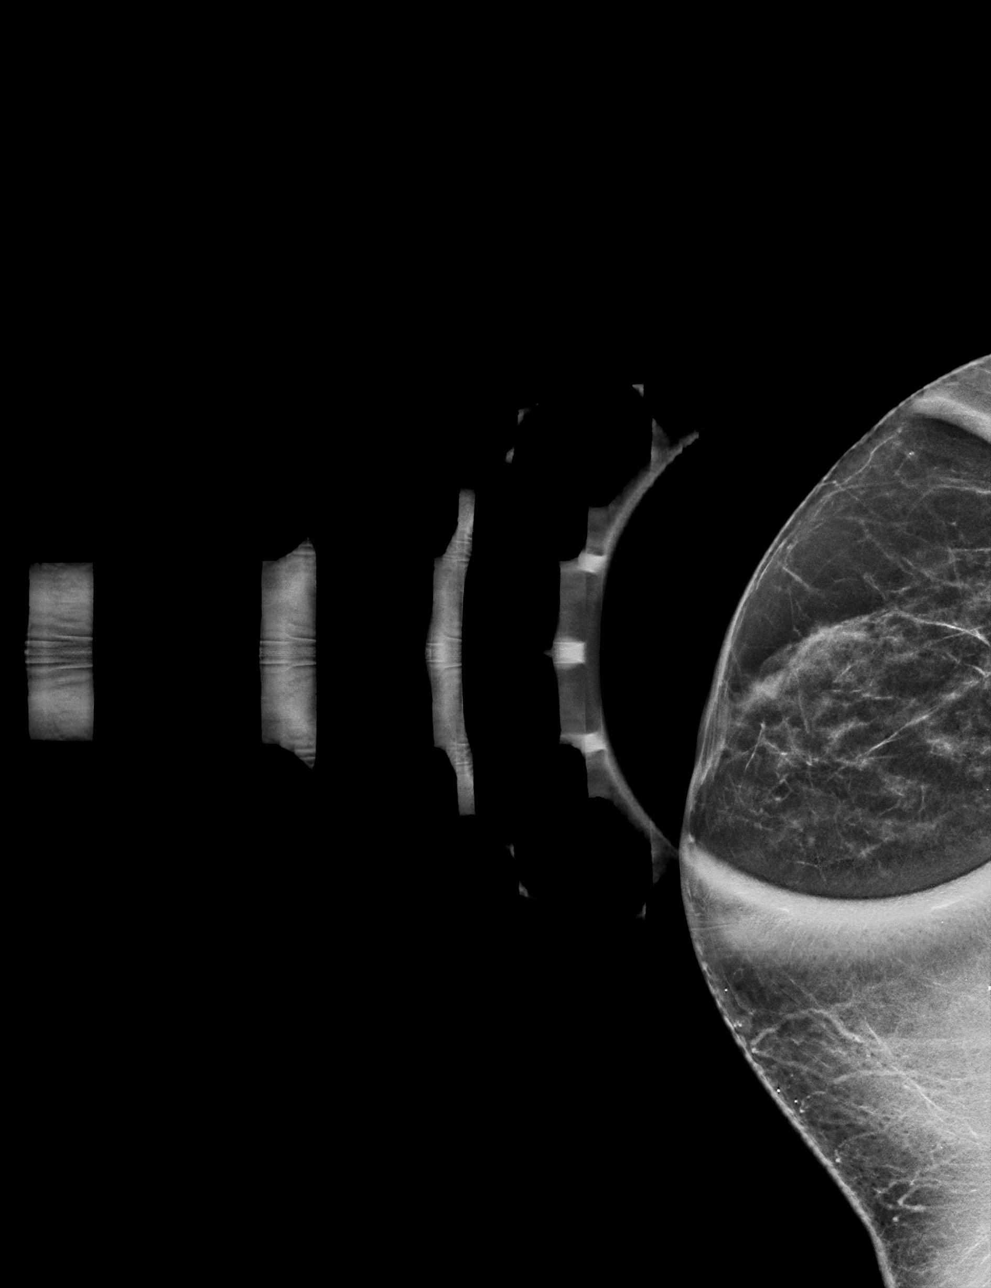

[R MLO synth-2D]
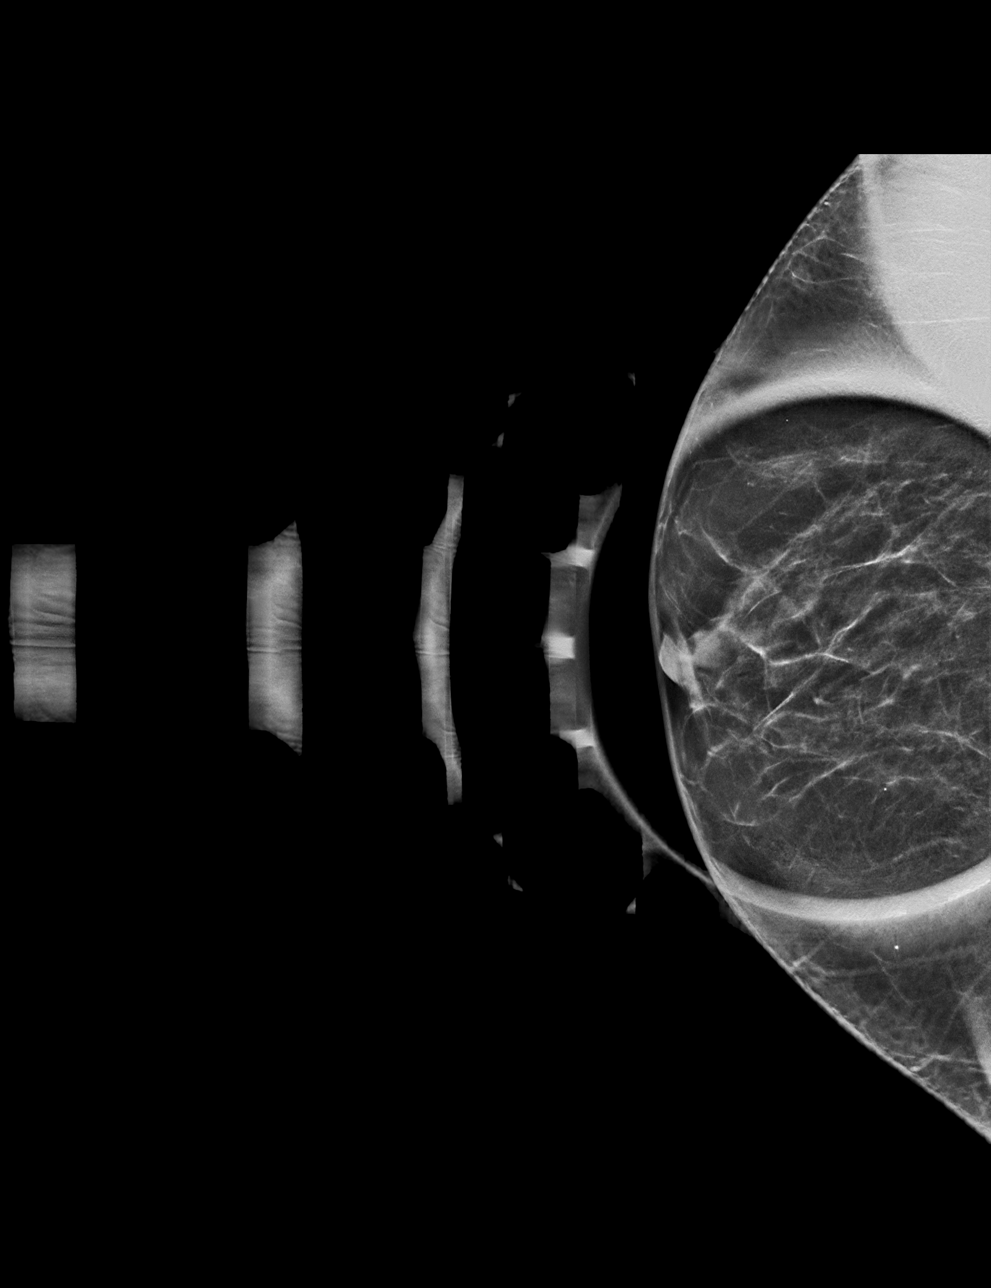

[R MLO tomo · tomo slice 28/55.0]
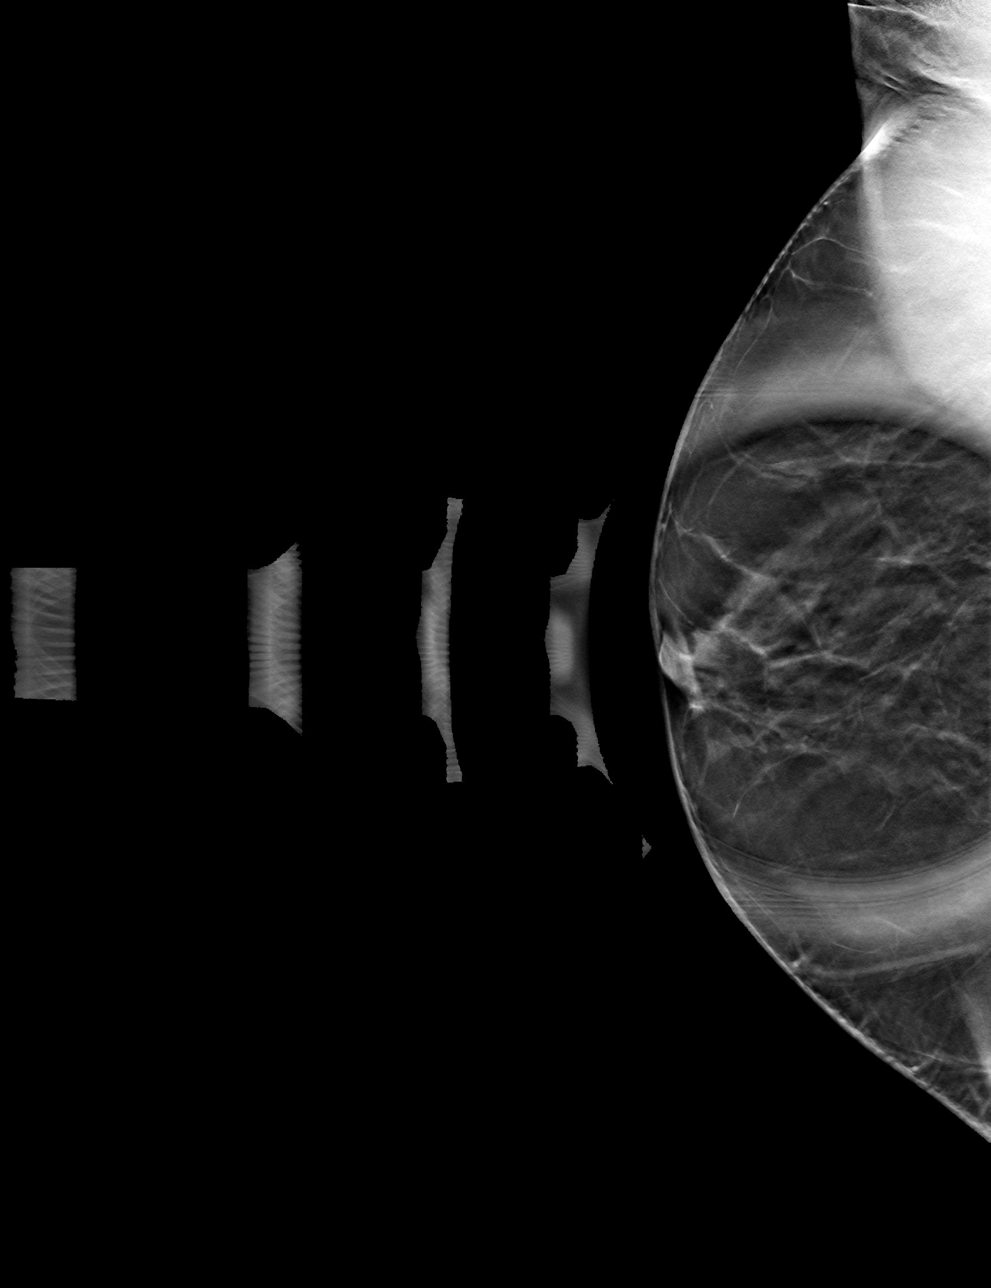

[R CC tomo · tomo slice 24/47.0]
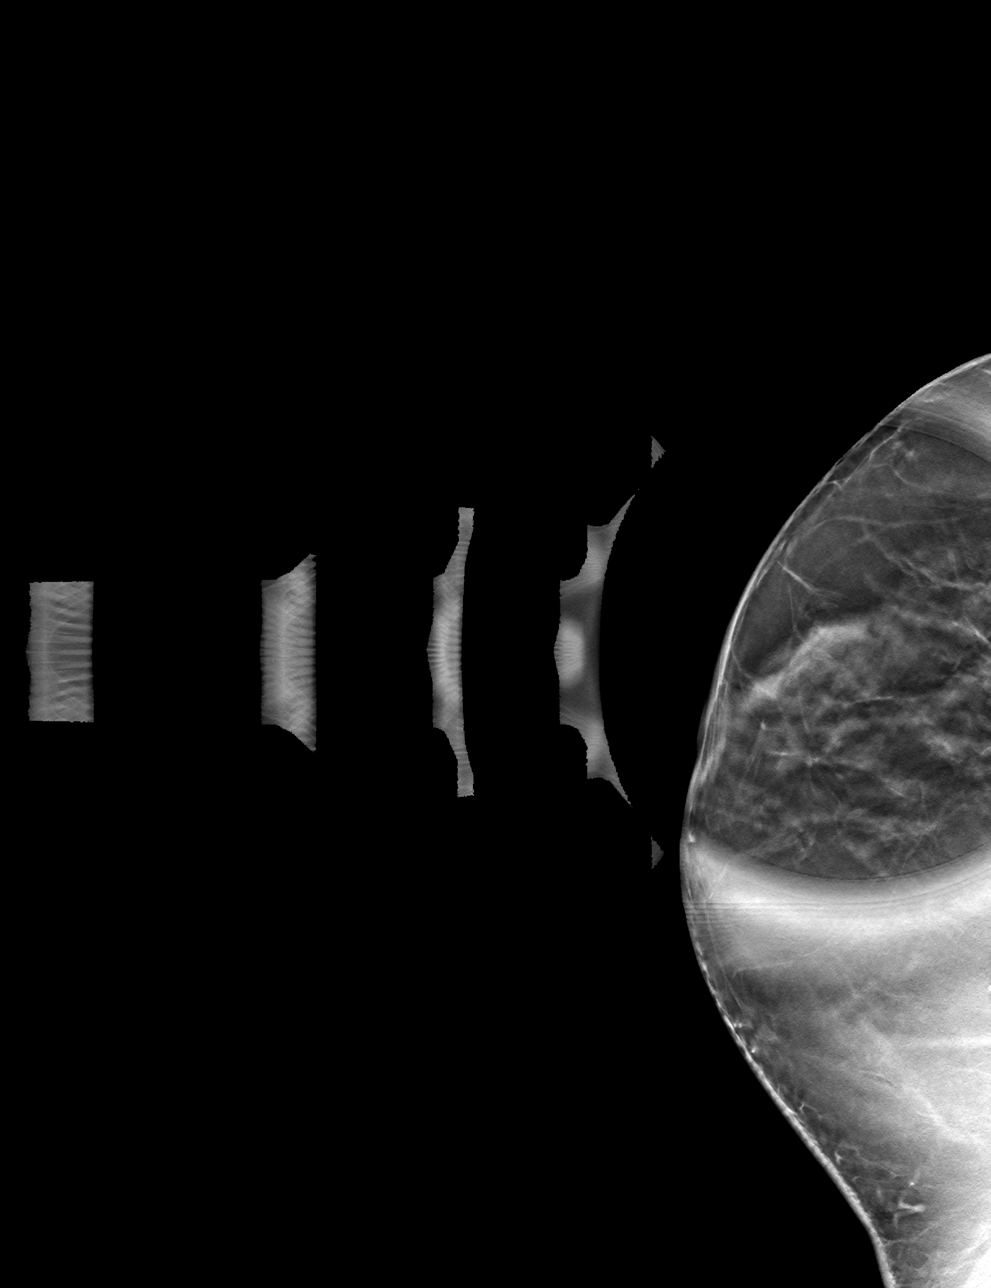

[4 of 12 positions shown; findings below may reference images not displayed]

ACR Breast Density Category b: There are scattered areas of
fibroglandular density.
FINDINGS: Previously described, possible mass in the subareolar right breast
resolves into well dispersed fatty and fibroglandular tissue on
today's additional views. The parenchymal pattern is stable from
prior mammograms. No suspicious findings are identified.

Mammographic images were processed with CAD.
IMPRESSION: No mammographic evidence of malignancy.

RECOMMENDATION:
Routine annual screening in 1 year.

I have discussed the findings and recommendations with the patient.
If applicable, a reminder letter will be sent to the patient
regarding the next appointment.

BI-RADS CATEGORY  1: Negative.

## 2020-08-21 ENCOUNTER — Ambulatory Visit (INDEPENDENT_AMBULATORY_CARE_PROVIDER_SITE_OTHER): Payer: Medicare PPO | Admitting: Orthopaedic Surgery

## 2020-08-21 ENCOUNTER — Encounter: Payer: Self-pay | Admitting: Orthopaedic Surgery

## 2020-08-21 ENCOUNTER — Other Ambulatory Visit: Payer: Self-pay

## 2020-08-21 DIAGNOSIS — M5416 Radiculopathy, lumbar region: Secondary | ICD-10-CM | POA: Diagnosis not present

## 2020-08-21 MED ORDER — PREDNISONE 10 MG (21) PO TBPK
ORAL_TABLET | ORAL | 0 refills | Status: DC
Start: 2020-08-21 — End: 2023-01-02

## 2020-08-21 NOTE — Progress Notes (Signed)
Office Visit Note   Patient: Andrea Wong           Date of Birth: 1934/05/18           MRN: [id number] Visit Date: 08/21/2020              Requested by: Alroy Dust, L.Marlou Sa, [place] [address] [place],  [place] [address] PCP: Alroy Dust, L.Marlou Sa, MD   Assessment & Plan: Visit Diagnoses:  1. Lumbar radiculopathy     Plan: Impression as lumbar radiculopathy sciatica.  We will send in a prescription for prednisone Dosepak.  Also had a brief discussion about physical therapy and home exercises but the patient like to try the medications first.  We will see her back as needed.  Follow-Up Instructions: Return if symptoms worsen or fail to improve.   Orders:  No orders of the defined types were placed in this encounter.  Meds ordered this encounter  Medications  . predniSONE (STERAPRED UNI-PAK 21 TAB) 10 MG (21) TBPK tablet    Sig: Take as directed    Dispense:  21 tablet    Refill:  0      Procedures: No procedures performed   Clinical Data: No additional findings.   Subjective: Chief Complaint  Patient presents with  . Right Hip - Pain  . Lower Back - Pain    Andrea Wong is a 87 year old female comes in for right hip and leg pain.  Denies any back or groin pain or injuries.  She has had pain for about a month.  Pain starts in the right upper hip and lower back region and radiates all the way down the lateral side of her leg.  Tylenol helps some.  Denies any focal motor or sensory deficits or bowel or bladder dysfunction.   Review of Systems  Constitutional: Negative.   HENT: Negative.   Eyes: Negative.   Respiratory: Negative.   Cardiovascular: Negative.   Endocrine: Negative.   Musculoskeletal: Negative.   Neurological: Negative.   Hematological: Negative.   Psychiatric/Behavioral: Negative.   All other systems reviewed and are negative.    Objective: Vital Signs: There were no vitals taken for this visit.  Physical Exam Vitals and nursing note  reviewed.  Constitutional:      Appearance: She is well-developed and well-nourished.  Pulmonary:     Effort: Pulmonary effort is normal.  Skin:    General: Skin is warm.     Capillary Refill: Capillary refill takes less than 2 seconds.  Neurological:     Mental Status: She is alert and oriented to person, place, and time.  Psychiatric:        Mood and Affect: Mood and affect normal.        Behavior: Behavior normal.        Thought Content: Thought content normal.        Judgment: Judgment normal.     Ortho Exam Right hip exam is benign.  Positive straight leg raise test.  Slightly diminished reflex that is symmetric to the contralateral side.  Good range of motion of the hip without pain.  Lateral hip is minimally tender. Specialty Comments:  No specialty comments available.  Imaging: No results found.   PMFS History: Patient Active Problem List   Diagnosis Date Noted  . Lumbar radiculopathy 08/21/2020  . Primary osteoarthritis of left knee 01/03/2020  . Primary osteoarthritis of right knee 01/03/2020   Past Medical History:  Diagnosis Date  . Allergy    seasonal   .  Cancer [facility])    breast CA (1970), Uterine CA (2003)  . Cataract    B cataracts extracted 2019  . Diabetes mellitus without complication ([place])   . GERD (gastroesophageal reflux disease)   . Hiatal hernia   . Hyperlipidemia   . Hypertension   . Osteopenia     History reviewed. No pertinent family history.  Past Surgical History:  Procedure Laterality Date  . BREAST BIOPSY    . BREAST LUMPECTOMY Left    1970   Social History   Occupational History  . Not on file  Tobacco Use  . Smoking status: Never Smoker  . Smokeless tobacco: Never Used  Substance and Sexual Activity  . Alcohol use: Yes    Alcohol/week: 2.0 standard drinks    Types: 2 Glasses of wine per week  . Drug use: Never  . Sexual activity: Not on file

## 2020-08-29 ENCOUNTER — Telehealth: Payer: Self-pay | Admitting: Orthopaedic Surgery

## 2020-08-29 NOTE — Telephone Encounter (Signed)
Pt called stating Dr.Xu prescribed her prednisone to help with her pain and she states now it's mostly gone but there's still some pain; she would like to either get a refill of the prednisone or a CB to be advised how she should be moving forward.   [phone]

## 2020-08-30 ENCOUNTER — Telehealth: Payer: Self-pay | Admitting: Orthopaedic Surgery

## 2020-08-30 NOTE — Telephone Encounter (Signed)
Pt called stating she was prescribed prednisone to help with her sciatica and it's been helping but she has run out and her sciatica is bothering her again so she would like a refill of her prednisone sent in please and would like to be notified when it's been sent.

## 2020-08-30 NOTE — Telephone Encounter (Signed)
Nsaids and physical therapy would be best to start next.  Can you put in pt referral if patient is in agreement?

## 2020-08-31 ENCOUNTER — Other Ambulatory Visit: Payer: Self-pay

## 2020-08-31 ENCOUNTER — Other Ambulatory Visit: Payer: Self-pay | Admitting: Physician Assistant

## 2020-08-31 DIAGNOSIS — M5416 Radiculopathy, lumbar region: Secondary | ICD-10-CM

## 2020-08-31 MED ORDER — DICLOFENAC SODIUM 75 MG PO TBEC: 75.0000 mg | DELAYED_RELEASE_TABLET | Freq: Two times a day (BID) | ORAL | 2 refills | Status: DC | PRN

## 2020-08-31 NOTE — Telephone Encounter (Signed)
We cannot refill prednisone, but I have sent in an nsaid.  Make sure does not take any other nsaids at same time.  Looks like dr. Erlinda Hong also mentioned pt in note when he saw her.  Is she interested in this?

## 2020-08-31 NOTE — Telephone Encounter (Signed)
Patient aware.

## 2020-09-04 ENCOUNTER — Telehealth: Payer: Self-pay | Admitting: Orthopaedic Surgery

## 2020-09-04 NOTE — Telephone Encounter (Signed)
See previous messages.  It is not good to take another steroid pack so soon.  If her pain is that bad and nsaids are not working, I can call in tramadol or tylenol 3 in additions to nsaids and we can get mri.  Just let me know

## 2020-09-04 NOTE — Telephone Encounter (Signed)
Patient would like prednisone called in for her. She says the Voltaren does not help with her pain. Her call back number is [phone]

## 2020-09-05 ENCOUNTER — Telehealth: Payer: Self-pay | Admitting: Orthopaedic Surgery

## 2020-09-05 ENCOUNTER — Other Ambulatory Visit: Payer: Self-pay | Admitting: Physician Assistant

## 2020-09-05 MED ORDER — TRAMADOL HCL 50 MG PO TABS: 50.0000 mg | ORAL_TABLET | Freq: Three times a day (TID) | ORAL | 0 refills | Status: DC | PRN

## 2020-09-05 NOTE — Telephone Encounter (Signed)
Pt called and informed.

## 2020-09-05 NOTE — Telephone Encounter (Signed)
Pt called back. See other message

## 2020-09-05 NOTE — Telephone Encounter (Signed)
Looks like codeine allergy so I sent in tramadol

## 2020-09-05 NOTE — Telephone Encounter (Signed)
Pt called checking on the prednisone rx and I made her aware of the previous messages and she states she would try the tramadol or the tylenol 3 whatever our office thinks is best. Pt would like to be notified when the rx is sent in

## 2020-09-14 ENCOUNTER — Other Ambulatory Visit: Payer: Self-pay | Admitting: Physician Assistant

## 2020-09-14 ENCOUNTER — Telehealth: Payer: Self-pay | Admitting: Orthopaedic Surgery

## 2020-09-14 MED ORDER — HYDROCODONE-ACETAMINOPHEN 5-325 MG PO TABS: 1.0000 | ORAL_TABLET | Freq: Two times a day (BID) | ORAL | 0 refills | Status: DC | PRN

## 2020-09-14 NOTE — Telephone Encounter (Signed)
Patient called. She would like something for pain other than tramadol. says she does not feel any relive from the tramadol. Her call back number is [phone]

## 2020-09-14 NOTE — Telephone Encounter (Signed)
I sent in small rx for norco. Have we ordered an MRI?

## 2020-09-14 NOTE — Telephone Encounter (Signed)
Pt called again asking if we would be sending anything in? She would like a CB to update her  [phone]

## 2020-09-19 DIAGNOSIS — M62830 Muscle spasm of back: Secondary | ICD-10-CM | POA: Diagnosis not present

## 2020-09-19 DIAGNOSIS — M62838 Other muscle spasm: Secondary | ICD-10-CM | POA: Diagnosis not present

## 2020-09-19 DIAGNOSIS — M9903 Segmental and somatic dysfunction of lumbar region: Secondary | ICD-10-CM | POA: Diagnosis not present

## 2020-09-19 DIAGNOSIS — M9902 Segmental and somatic dysfunction of thoracic region: Secondary | ICD-10-CM | POA: Diagnosis not present

## 2020-09-19 DIAGNOSIS — M9901 Segmental and somatic dysfunction of cervical region: Secondary | ICD-10-CM | POA: Diagnosis not present

## 2020-09-19 DIAGNOSIS — M542 Cervicalgia: Secondary | ICD-10-CM | POA: Diagnosis not present

## 2020-09-19 DIAGNOSIS — M545 Low back pain, unspecified: Secondary | ICD-10-CM | POA: Diagnosis not present

## 2020-09-19 DIAGNOSIS — M546 Pain in thoracic spine: Secondary | ICD-10-CM | POA: Diagnosis not present

## 2020-09-25 DIAGNOSIS — M9901 Segmental and somatic dysfunction of cervical region: Secondary | ICD-10-CM | POA: Diagnosis not present

## 2020-09-25 DIAGNOSIS — M9903 Segmental and somatic dysfunction of lumbar region: Secondary | ICD-10-CM | POA: Diagnosis not present

## 2020-09-25 DIAGNOSIS — M9902 Segmental and somatic dysfunction of thoracic region: Secondary | ICD-10-CM | POA: Diagnosis not present

## 2020-09-25 DIAGNOSIS — M62838 Other muscle spasm: Secondary | ICD-10-CM | POA: Diagnosis not present

## 2020-09-25 DIAGNOSIS — M542 Cervicalgia: Secondary | ICD-10-CM | POA: Diagnosis not present

## 2020-09-25 DIAGNOSIS — M545 Low back pain, unspecified: Secondary | ICD-10-CM | POA: Diagnosis not present

## 2020-09-25 DIAGNOSIS — M546 Pain in thoracic spine: Secondary | ICD-10-CM | POA: Diagnosis not present

## 2020-09-25 DIAGNOSIS — M62830 Muscle spasm of back: Secondary | ICD-10-CM | POA: Diagnosis not present

## 2020-10-08 DIAGNOSIS — M545 Low back pain, unspecified: Secondary | ICD-10-CM | POA: Diagnosis not present

## 2020-10-08 DIAGNOSIS — M9901 Segmental and somatic dysfunction of cervical region: Secondary | ICD-10-CM | POA: Diagnosis not present

## 2020-10-08 DIAGNOSIS — M542 Cervicalgia: Secondary | ICD-10-CM | POA: Diagnosis not present

## 2020-10-08 DIAGNOSIS — M546 Pain in thoracic spine: Secondary | ICD-10-CM | POA: Diagnosis not present

## 2020-10-08 DIAGNOSIS — M9902 Segmental and somatic dysfunction of thoracic region: Secondary | ICD-10-CM | POA: Diagnosis not present

## 2020-10-08 DIAGNOSIS — M62830 Muscle spasm of back: Secondary | ICD-10-CM | POA: Diagnosis not present

## 2020-10-08 DIAGNOSIS — M62838 Other muscle spasm: Secondary | ICD-10-CM | POA: Diagnosis not present

## 2020-10-08 DIAGNOSIS — M9903 Segmental and somatic dysfunction of lumbar region: Secondary | ICD-10-CM | POA: Diagnosis not present

## 2020-10-10 DIAGNOSIS — M62838 Other muscle spasm: Secondary | ICD-10-CM | POA: Diagnosis not present

## 2020-10-10 DIAGNOSIS — M542 Cervicalgia: Secondary | ICD-10-CM | POA: Diagnosis not present

## 2020-10-10 DIAGNOSIS — M9903 Segmental and somatic dysfunction of lumbar region: Secondary | ICD-10-CM | POA: Diagnosis not present

## 2020-10-10 DIAGNOSIS — M9902 Segmental and somatic dysfunction of thoracic region: Secondary | ICD-10-CM | POA: Diagnosis not present

## 2020-10-10 DIAGNOSIS — M9901 Segmental and somatic dysfunction of cervical region: Secondary | ICD-10-CM | POA: Diagnosis not present

## 2020-10-10 DIAGNOSIS — M545 Low back pain, unspecified: Secondary | ICD-10-CM | POA: Diagnosis not present

## 2020-10-10 DIAGNOSIS — M546 Pain in thoracic spine: Secondary | ICD-10-CM | POA: Diagnosis not present

## 2020-10-10 DIAGNOSIS — M62830 Muscle spasm of back: Secondary | ICD-10-CM | POA: Diagnosis not present

## 2020-10-11 DIAGNOSIS — M9902 Segmental and somatic dysfunction of thoracic region: Secondary | ICD-10-CM | POA: Diagnosis not present

## 2020-10-11 DIAGNOSIS — M546 Pain in thoracic spine: Secondary | ICD-10-CM | POA: Diagnosis not present

## 2020-10-11 DIAGNOSIS — M62838 Other muscle spasm: Secondary | ICD-10-CM | POA: Diagnosis not present

## 2020-10-11 DIAGNOSIS — M9903 Segmental and somatic dysfunction of lumbar region: Secondary | ICD-10-CM | POA: Diagnosis not present

## 2020-10-11 DIAGNOSIS — M542 Cervicalgia: Secondary | ICD-10-CM | POA: Diagnosis not present

## 2020-10-11 DIAGNOSIS — M62830 Muscle spasm of back: Secondary | ICD-10-CM | POA: Diagnosis not present

## 2020-10-11 DIAGNOSIS — M545 Low back pain, unspecified: Secondary | ICD-10-CM | POA: Diagnosis not present

## 2020-10-11 DIAGNOSIS — M9901 Segmental and somatic dysfunction of cervical region: Secondary | ICD-10-CM | POA: Diagnosis not present

## 2020-10-15 DIAGNOSIS — M545 Low back pain, unspecified: Secondary | ICD-10-CM | POA: Diagnosis not present

## 2020-10-15 DIAGNOSIS — M546 Pain in thoracic spine: Secondary | ICD-10-CM | POA: Diagnosis not present

## 2020-10-15 DIAGNOSIS — M542 Cervicalgia: Secondary | ICD-10-CM | POA: Diagnosis not present

## 2020-10-15 DIAGNOSIS — M9903 Segmental and somatic dysfunction of lumbar region: Secondary | ICD-10-CM | POA: Diagnosis not present

## 2020-10-15 DIAGNOSIS — M62838 Other muscle spasm: Secondary | ICD-10-CM | POA: Diagnosis not present

## 2020-10-15 DIAGNOSIS — M62830 Muscle spasm of back: Secondary | ICD-10-CM | POA: Diagnosis not present

## 2020-10-15 DIAGNOSIS — M9902 Segmental and somatic dysfunction of thoracic region: Secondary | ICD-10-CM | POA: Diagnosis not present

## 2020-10-15 DIAGNOSIS — M9901 Segmental and somatic dysfunction of cervical region: Secondary | ICD-10-CM | POA: Diagnosis not present

## 2020-10-17 DIAGNOSIS — M9903 Segmental and somatic dysfunction of lumbar region: Secondary | ICD-10-CM | POA: Diagnosis not present

## 2020-10-17 DIAGNOSIS — M542 Cervicalgia: Secondary | ICD-10-CM | POA: Diagnosis not present

## 2020-10-17 DIAGNOSIS — M546 Pain in thoracic spine: Secondary | ICD-10-CM | POA: Diagnosis not present

## 2020-10-17 DIAGNOSIS — M9901 Segmental and somatic dysfunction of cervical region: Secondary | ICD-10-CM | POA: Diagnosis not present

## 2020-10-17 DIAGNOSIS — M545 Low back pain, unspecified: Secondary | ICD-10-CM | POA: Diagnosis not present

## 2020-10-17 DIAGNOSIS — M62830 Muscle spasm of back: Secondary | ICD-10-CM | POA: Diagnosis not present

## 2020-10-17 DIAGNOSIS — M9902 Segmental and somatic dysfunction of thoracic region: Secondary | ICD-10-CM | POA: Diagnosis not present

## 2020-10-17 DIAGNOSIS — M62838 Other muscle spasm: Secondary | ICD-10-CM | POA: Diagnosis not present

## 2020-10-18 DIAGNOSIS — M9901 Segmental and somatic dysfunction of cervical region: Secondary | ICD-10-CM | POA: Diagnosis not present

## 2020-10-18 DIAGNOSIS — M62830 Muscle spasm of back: Secondary | ICD-10-CM | POA: Diagnosis not present

## 2020-10-18 DIAGNOSIS — M542 Cervicalgia: Secondary | ICD-10-CM | POA: Diagnosis not present

## 2020-10-18 DIAGNOSIS — M9903 Segmental and somatic dysfunction of lumbar region: Secondary | ICD-10-CM | POA: Diagnosis not present

## 2020-10-18 DIAGNOSIS — M62838 Other muscle spasm: Secondary | ICD-10-CM | POA: Diagnosis not present

## 2020-10-18 DIAGNOSIS — M9902 Segmental and somatic dysfunction of thoracic region: Secondary | ICD-10-CM | POA: Diagnosis not present

## 2020-10-18 DIAGNOSIS — M545 Low back pain, unspecified: Secondary | ICD-10-CM | POA: Diagnosis not present

## 2020-10-18 DIAGNOSIS — M546 Pain in thoracic spine: Secondary | ICD-10-CM | POA: Diagnosis not present

## 2020-10-22 DIAGNOSIS — M546 Pain in thoracic spine: Secondary | ICD-10-CM | POA: Diagnosis not present

## 2020-10-22 DIAGNOSIS — M62830 Muscle spasm of back: Secondary | ICD-10-CM | POA: Diagnosis not present

## 2020-10-22 DIAGNOSIS — M9902 Segmental and somatic dysfunction of thoracic region: Secondary | ICD-10-CM | POA: Diagnosis not present

## 2020-10-22 DIAGNOSIS — M9901 Segmental and somatic dysfunction of cervical region: Secondary | ICD-10-CM | POA: Diagnosis not present

## 2020-10-22 DIAGNOSIS — M542 Cervicalgia: Secondary | ICD-10-CM | POA: Diagnosis not present

## 2020-10-22 DIAGNOSIS — M62838 Other muscle spasm: Secondary | ICD-10-CM | POA: Diagnosis not present

## 2020-10-22 DIAGNOSIS — M9903 Segmental and somatic dysfunction of lumbar region: Secondary | ICD-10-CM | POA: Diagnosis not present

## 2020-10-22 DIAGNOSIS — M545 Low back pain, unspecified: Secondary | ICD-10-CM | POA: Diagnosis not present

## 2020-10-24 DIAGNOSIS — M9903 Segmental and somatic dysfunction of lumbar region: Secondary | ICD-10-CM | POA: Diagnosis not present

## 2020-10-24 DIAGNOSIS — M9902 Segmental and somatic dysfunction of thoracic region: Secondary | ICD-10-CM | POA: Diagnosis not present

## 2020-10-24 DIAGNOSIS — M62830 Muscle spasm of back: Secondary | ICD-10-CM | POA: Diagnosis not present

## 2020-10-24 DIAGNOSIS — M546 Pain in thoracic spine: Secondary | ICD-10-CM | POA: Diagnosis not present

## 2020-10-24 DIAGNOSIS — M9901 Segmental and somatic dysfunction of cervical region: Secondary | ICD-10-CM | POA: Diagnosis not present

## 2020-10-24 DIAGNOSIS — M545 Low back pain, unspecified: Secondary | ICD-10-CM | POA: Diagnosis not present

## 2020-10-24 DIAGNOSIS — M542 Cervicalgia: Secondary | ICD-10-CM | POA: Diagnosis not present

## 2020-10-24 DIAGNOSIS — M62838 Other muscle spasm: Secondary | ICD-10-CM | POA: Diagnosis not present

## 2020-10-25 DIAGNOSIS — M62830 Muscle spasm of back: Secondary | ICD-10-CM | POA: Diagnosis not present

## 2020-10-25 DIAGNOSIS — M62838 Other muscle spasm: Secondary | ICD-10-CM | POA: Diagnosis not present

## 2020-10-25 DIAGNOSIS — M542 Cervicalgia: Secondary | ICD-10-CM | POA: Diagnosis not present

## 2020-10-25 DIAGNOSIS — M545 Low back pain, unspecified: Secondary | ICD-10-CM | POA: Diagnosis not present

## 2020-10-25 DIAGNOSIS — M9903 Segmental and somatic dysfunction of lumbar region: Secondary | ICD-10-CM | POA: Diagnosis not present

## 2020-10-25 DIAGNOSIS — M9902 Segmental and somatic dysfunction of thoracic region: Secondary | ICD-10-CM | POA: Diagnosis not present

## 2020-10-25 DIAGNOSIS — M9901 Segmental and somatic dysfunction of cervical region: Secondary | ICD-10-CM | POA: Diagnosis not present

## 2020-10-25 DIAGNOSIS — M546 Pain in thoracic spine: Secondary | ICD-10-CM | POA: Diagnosis not present

## 2020-10-29 DIAGNOSIS — M62830 Muscle spasm of back: Secondary | ICD-10-CM | POA: Diagnosis not present

## 2020-10-29 DIAGNOSIS — M546 Pain in thoracic spine: Secondary | ICD-10-CM | POA: Diagnosis not present

## 2020-10-29 DIAGNOSIS — M9903 Segmental and somatic dysfunction of lumbar region: Secondary | ICD-10-CM | POA: Diagnosis not present

## 2020-10-29 DIAGNOSIS — M545 Low back pain, unspecified: Secondary | ICD-10-CM | POA: Diagnosis not present

## 2020-10-29 DIAGNOSIS — M62838 Other muscle spasm: Secondary | ICD-10-CM | POA: Diagnosis not present

## 2020-10-29 DIAGNOSIS — M9901 Segmental and somatic dysfunction of cervical region: Secondary | ICD-10-CM | POA: Diagnosis not present

## 2020-10-29 DIAGNOSIS — M9902 Segmental and somatic dysfunction of thoracic region: Secondary | ICD-10-CM | POA: Diagnosis not present

## 2020-10-29 DIAGNOSIS — M542 Cervicalgia: Secondary | ICD-10-CM | POA: Diagnosis not present

## 2020-10-31 DIAGNOSIS — M546 Pain in thoracic spine: Secondary | ICD-10-CM | POA: Diagnosis not present

## 2020-10-31 DIAGNOSIS — M62838 Other muscle spasm: Secondary | ICD-10-CM | POA: Diagnosis not present

## 2020-10-31 DIAGNOSIS — M545 Low back pain, unspecified: Secondary | ICD-10-CM | POA: Diagnosis not present

## 2020-10-31 DIAGNOSIS — M9901 Segmental and somatic dysfunction of cervical region: Secondary | ICD-10-CM | POA: Diagnosis not present

## 2020-10-31 DIAGNOSIS — M62830 Muscle spasm of back: Secondary | ICD-10-CM | POA: Diagnosis not present

## 2020-10-31 DIAGNOSIS — M542 Cervicalgia: Secondary | ICD-10-CM | POA: Diagnosis not present

## 2020-10-31 DIAGNOSIS — M9903 Segmental and somatic dysfunction of lumbar region: Secondary | ICD-10-CM | POA: Diagnosis not present

## 2020-10-31 DIAGNOSIS — M9902 Segmental and somatic dysfunction of thoracic region: Secondary | ICD-10-CM | POA: Diagnosis not present

## 2020-12-10 ENCOUNTER — Other Ambulatory Visit: Payer: Self-pay | Admitting: Family Medicine

## 2020-12-10 DIAGNOSIS — Z1231 Encounter for screening mammogram for malignant neoplasm of breast: Secondary | ICD-10-CM

## 2020-12-11 DIAGNOSIS — J309 Allergic rhinitis, unspecified: Secondary | ICD-10-CM | POA: Diagnosis not present

## 2020-12-11 DIAGNOSIS — I10 Essential (primary) hypertension: Secondary | ICD-10-CM | POA: Diagnosis not present

## 2020-12-11 DIAGNOSIS — K219 Gastro-esophageal reflux disease without esophagitis: Secondary | ICD-10-CM | POA: Diagnosis not present

## 2020-12-11 DIAGNOSIS — M8580 Other specified disorders of bone density and structure, unspecified site: Secondary | ICD-10-CM | POA: Diagnosis not present

## 2020-12-11 DIAGNOSIS — R2689 Other abnormalities of gait and mobility: Secondary | ICD-10-CM | POA: Diagnosis not present

## 2020-12-11 DIAGNOSIS — E7800 Pure hypercholesterolemia, unspecified: Secondary | ICD-10-CM | POA: Diagnosis not present

## 2020-12-11 DIAGNOSIS — E1169 Type 2 diabetes mellitus with other specified complication: Secondary | ICD-10-CM | POA: Diagnosis not present

## 2020-12-11 DIAGNOSIS — Z0000 Encounter for general adult medical examination without abnormal findings: Secondary | ICD-10-CM | POA: Diagnosis not present

## 2021-01-30 ENCOUNTER — Ambulatory Visit
Admission: RE | Admit: 2021-01-30 | Discharge: 2021-01-30 | Disposition: A | Discharge status: Home / Self Care | Payer: Medicare PPO | Source: Ambulatory Visit | Attending: Family Medicine | Admitting: Family Medicine

## 2021-01-30 ENCOUNTER — Other Ambulatory Visit: Payer: Self-pay

## 2021-01-30 DIAGNOSIS — Z1231 Encounter for screening mammogram for malignant neoplasm of breast: Secondary | ICD-10-CM | POA: Diagnosis not present

## 2021-05-25 DIAGNOSIS — Z23 Encounter for immunization: Secondary | ICD-10-CM | POA: Diagnosis not present

## 2021-06-20 DIAGNOSIS — R2689 Other abnormalities of gait and mobility: Secondary | ICD-10-CM | POA: Diagnosis not present

## 2021-06-20 DIAGNOSIS — I10 Essential (primary) hypertension: Secondary | ICD-10-CM | POA: Diagnosis not present

## 2021-06-20 DIAGNOSIS — E7800 Pure hypercholesterolemia, unspecified: Secondary | ICD-10-CM | POA: Diagnosis not present

## 2021-06-20 DIAGNOSIS — Z23 Encounter for immunization: Secondary | ICD-10-CM | POA: Diagnosis not present

## 2021-06-20 DIAGNOSIS — E1169 Type 2 diabetes mellitus with other specified complication: Secondary | ICD-10-CM | POA: Diagnosis not present

## 2021-06-20 DIAGNOSIS — J309 Allergic rhinitis, unspecified: Secondary | ICD-10-CM | POA: Diagnosis not present

## 2021-06-20 DIAGNOSIS — K219 Gastro-esophageal reflux disease without esophagitis: Secondary | ICD-10-CM | POA: Diagnosis not present

## 2021-06-20 DIAGNOSIS — M8580 Other specified disorders of bone density and structure, unspecified site: Secondary | ICD-10-CM | POA: Diagnosis not present

## 2021-12-19 DIAGNOSIS — E1169 Type 2 diabetes mellitus with other specified complication: Secondary | ICD-10-CM | POA: Diagnosis not present

## 2021-12-19 DIAGNOSIS — Z0000 Encounter for general adult medical examination without abnormal findings: Secondary | ICD-10-CM | POA: Diagnosis not present

## 2021-12-19 DIAGNOSIS — J309 Allergic rhinitis, unspecified: Secondary | ICD-10-CM | POA: Diagnosis not present

## 2021-12-19 DIAGNOSIS — R2689 Other abnormalities of gait and mobility: Secondary | ICD-10-CM | POA: Diagnosis not present

## 2021-12-19 DIAGNOSIS — M8580 Other specified disorders of bone density and structure, unspecified site: Secondary | ICD-10-CM | POA: Diagnosis not present

## 2021-12-19 DIAGNOSIS — E7800 Pure hypercholesterolemia, unspecified: Secondary | ICD-10-CM | POA: Diagnosis not present

## 2021-12-19 DIAGNOSIS — I10 Essential (primary) hypertension: Secondary | ICD-10-CM | POA: Diagnosis not present

## 2021-12-19 DIAGNOSIS — K219 Gastro-esophageal reflux disease without esophagitis: Secondary | ICD-10-CM | POA: Diagnosis not present

## 2022-01-03 ENCOUNTER — Other Ambulatory Visit: Payer: Self-pay | Admitting: Family Medicine

## 2022-01-03 DIAGNOSIS — Z1231 Encounter for screening mammogram for malignant neoplasm of breast: Secondary | ICD-10-CM

## 2022-02-06 ENCOUNTER — Ambulatory Visit
Admission: RE | Admit: 2022-02-06 | Discharge: 2022-02-06 | Disposition: A | Discharge status: Home / Self Care | Payer: Medicare PPO | Source: Ambulatory Visit | Attending: Family Medicine | Admitting: Family Medicine

## 2022-02-06 DIAGNOSIS — Z1231 Encounter for screening mammogram for malignant neoplasm of breast: Secondary | ICD-10-CM

## 2022-04-22 DIAGNOSIS — R2689 Other abnormalities of gait and mobility: Secondary | ICD-10-CM | POA: Diagnosis not present

## 2022-04-22 DIAGNOSIS — M25569 Pain in unspecified knee: Secondary | ICD-10-CM | POA: Diagnosis not present

## 2022-06-24 DIAGNOSIS — E7800 Pure hypercholesterolemia, unspecified: Secondary | ICD-10-CM | POA: Diagnosis not present

## 2022-06-24 DIAGNOSIS — R2689 Other abnormalities of gait and mobility: Secondary | ICD-10-CM | POA: Diagnosis not present

## 2022-06-24 DIAGNOSIS — M8580 Other specified disorders of bone density and structure, unspecified site: Secondary | ICD-10-CM | POA: Diagnosis not present

## 2022-06-24 DIAGNOSIS — I10 Essential (primary) hypertension: Secondary | ICD-10-CM | POA: Diagnosis not present

## 2022-06-24 DIAGNOSIS — Z23 Encounter for immunization: Secondary | ICD-10-CM | POA: Diagnosis not present

## 2022-06-24 DIAGNOSIS — E1169 Type 2 diabetes mellitus with other specified complication: Secondary | ICD-10-CM | POA: Diagnosis not present

## 2022-06-24 DIAGNOSIS — K219 Gastro-esophageal reflux disease without esophagitis: Secondary | ICD-10-CM | POA: Diagnosis not present

## 2022-06-24 DIAGNOSIS — J309 Allergic rhinitis, unspecified: Secondary | ICD-10-CM | POA: Diagnosis not present

## 2022-12-23 DIAGNOSIS — K219 Gastro-esophageal reflux disease without esophagitis: Secondary | ICD-10-CM | POA: Diagnosis not present

## 2022-12-23 DIAGNOSIS — J309 Allergic rhinitis, unspecified: Secondary | ICD-10-CM | POA: Diagnosis not present

## 2022-12-23 DIAGNOSIS — E119 Type 2 diabetes mellitus without complications: Secondary | ICD-10-CM | POA: Diagnosis not present

## 2022-12-23 DIAGNOSIS — Z9989 Dependence on other enabling machines and devices: Secondary | ICD-10-CM | POA: Diagnosis not present

## 2022-12-23 DIAGNOSIS — E1169 Type 2 diabetes mellitus with other specified complication: Secondary | ICD-10-CM | POA: Diagnosis not present

## 2022-12-23 DIAGNOSIS — I10 Essential (primary) hypertension: Secondary | ICD-10-CM | POA: Diagnosis not present

## 2022-12-23 DIAGNOSIS — M8580 Other specified disorders of bone density and structure, unspecified site: Secondary | ICD-10-CM | POA: Diagnosis not present

## 2022-12-23 DIAGNOSIS — R2689 Other abnormalities of gait and mobility: Secondary | ICD-10-CM | POA: Diagnosis not present

## 2022-12-23 DIAGNOSIS — E7800 Pure hypercholesterolemia, unspecified: Secondary | ICD-10-CM | POA: Diagnosis not present

## 2022-12-29 ENCOUNTER — Emergency Department (HOSPITAL_COMMUNITY): Payer: Medicare PPO

## 2022-12-29 ENCOUNTER — Inpatient Hospital Stay (HOSPITAL_COMMUNITY): Payer: Medicare PPO | Admitting: Anesthesiology

## 2022-12-29 ENCOUNTER — Inpatient Hospital Stay (HOSPITAL_COMMUNITY)
Admission: EM | Admit: 2022-12-29 | Discharge: 2023-01-02 | DRG: 522 | Disposition: A | Discharge status: Skilled Nursing Facility | Payer: Medicare PPO | Attending: Internal Medicine | Admitting: Internal Medicine

## 2022-12-29 ENCOUNTER — Other Ambulatory Visit: Payer: Self-pay

## 2022-12-29 ENCOUNTER — Encounter (HOSPITAL_COMMUNITY): Payer: Self-pay

## 2022-12-29 DIAGNOSIS — R0902 Hypoxemia: Secondary | ICD-10-CM | POA: Diagnosis not present

## 2022-12-29 DIAGNOSIS — Z8542 Personal history of malignant neoplasm of other parts of uterus: Secondary | ICD-10-CM | POA: Diagnosis not present

## 2022-12-29 DIAGNOSIS — M80052A Age-related osteoporosis with current pathological fracture, left femur, initial encounter for fracture: Secondary | ICD-10-CM | POA: Diagnosis not present

## 2022-12-29 DIAGNOSIS — Z885 Allergy status to narcotic agent status: Secondary | ICD-10-CM | POA: Diagnosis not present

## 2022-12-29 DIAGNOSIS — K219 Gastro-esophageal reflux disease without esophagitis: Secondary | ICD-10-CM | POA: Diagnosis present

## 2022-12-29 DIAGNOSIS — Z7984 Long term (current) use of oral hypoglycemic drugs: Secondary | ICD-10-CM

## 2022-12-29 DIAGNOSIS — S72042A Displaced fracture of base of neck of left femur, initial encounter for closed fracture: Secondary | ICD-10-CM | POA: Diagnosis not present

## 2022-12-29 DIAGNOSIS — E7800 Pure hypercholesterolemia, unspecified: Secondary | ICD-10-CM | POA: Diagnosis present

## 2022-12-29 DIAGNOSIS — Z79899 Other long term (current) drug therapy: Secondary | ICD-10-CM

## 2022-12-29 DIAGNOSIS — J309 Allergic rhinitis, unspecified: Secondary | ICD-10-CM | POA: Diagnosis not present

## 2022-12-29 DIAGNOSIS — M25559 Pain in unspecified hip: Secondary | ICD-10-CM | POA: Diagnosis not present

## 2022-12-29 DIAGNOSIS — M8580 Other specified disorders of bone density and structure, unspecified site: Secondary | ICD-10-CM | POA: Diagnosis not present

## 2022-12-29 DIAGNOSIS — M1612 Unilateral primary osteoarthritis, left hip: Secondary | ICD-10-CM | POA: Diagnosis not present

## 2022-12-29 DIAGNOSIS — Z853 Personal history of malignant neoplasm of breast: Secondary | ICD-10-CM

## 2022-12-29 DIAGNOSIS — W010XXA Fall on same level from slipping, tripping and stumbling without subsequent striking against object, initial encounter: Secondary | ICD-10-CM | POA: Diagnosis present

## 2022-12-29 DIAGNOSIS — Z7401 Bed confinement status: Secondary | ICD-10-CM | POA: Diagnosis not present

## 2022-12-29 DIAGNOSIS — Z96642 Presence of left artificial hip joint: Secondary | ICD-10-CM | POA: Diagnosis not present

## 2022-12-29 DIAGNOSIS — K449 Diaphragmatic hernia without obstruction or gangrene: Secondary | ICD-10-CM | POA: Diagnosis not present

## 2022-12-29 DIAGNOSIS — S72002A Fracture of unspecified part of neck of left femur, initial encounter for closed fracture: Secondary | ICD-10-CM | POA: Diagnosis present

## 2022-12-29 DIAGNOSIS — S72009A Fracture of unspecified part of neck of unspecified femur, initial encounter for closed fracture: Secondary | ICD-10-CM | POA: Diagnosis not present

## 2022-12-29 DIAGNOSIS — M47816 Spondylosis without myelopathy or radiculopathy, lumbar region: Secondary | ICD-10-CM | POA: Diagnosis not present

## 2022-12-29 DIAGNOSIS — E876 Hypokalemia: Secondary | ICD-10-CM | POA: Diagnosis not present

## 2022-12-29 DIAGNOSIS — Z888 Allergy status to other drugs, medicaments and biological substances status: Secondary | ICD-10-CM | POA: Diagnosis not present

## 2022-12-29 DIAGNOSIS — Z471 Aftercare following joint replacement surgery: Secondary | ICD-10-CM | POA: Diagnosis not present

## 2022-12-29 DIAGNOSIS — Y92000 Kitchen of unspecified non-institutional (private) residence as  the place of occurrence of the external cause: Secondary | ICD-10-CM

## 2022-12-29 DIAGNOSIS — K5900 Constipation, unspecified: Secondary | ICD-10-CM | POA: Diagnosis not present

## 2022-12-29 DIAGNOSIS — R262 Difficulty in walking, not elsewhere classified: Secondary | ICD-10-CM | POA: Diagnosis not present

## 2022-12-29 DIAGNOSIS — Z7982 Long term (current) use of aspirin: Secondary | ICD-10-CM

## 2022-12-29 DIAGNOSIS — S72012A Unspecified intracapsular fracture of left femur, initial encounter for closed fracture: Secondary | ICD-10-CM | POA: Diagnosis not present

## 2022-12-29 DIAGNOSIS — E119 Type 2 diabetes mellitus without complications: Secondary | ICD-10-CM | POA: Diagnosis not present

## 2022-12-29 DIAGNOSIS — I10 Essential (primary) hypertension: Secondary | ICD-10-CM | POA: Diagnosis not present

## 2022-12-29 DIAGNOSIS — M545 Low back pain, unspecified: Secondary | ICD-10-CM | POA: Diagnosis not present

## 2022-12-29 DIAGNOSIS — S72042D Displaced fracture of base of neck of left femur, subsequent encounter for closed fracture with routine healing: Secondary | ICD-10-CM | POA: Diagnosis not present

## 2022-12-29 DIAGNOSIS — G8918 Other acute postprocedural pain: Secondary | ICD-10-CM | POA: Diagnosis not present

## 2022-12-29 DIAGNOSIS — M79606 Pain in leg, unspecified: Secondary | ICD-10-CM | POA: Diagnosis not present

## 2022-12-29 DIAGNOSIS — M25552 Pain in left hip: Secondary | ICD-10-CM | POA: Diagnosis not present

## 2022-12-29 LAB — CBG MONITORING, ED: Glucose-Capillary: 159 mg/dL — ABNORMAL HIGH (ref 70–99)

## 2022-12-29 LAB — BASIC METABOLIC PANEL
Anion gap: 10 (ref 5–15)
BUN: 9 mg/dL (ref 8–23)
CO2: 26 mmol/L (ref 22–32)
Calcium: 9.4 mg/dL (ref 8.9–10.3)
Chloride: 98 mmol/L (ref 98–111)
Creatinine, Ser: 0.72 mg/dL (ref 0.44–1.00)
GFR, Estimated: >60 mL/min (ref >60)
Glucose, Bld: 156 mg/dL — ABNORMAL HIGH (ref 70–99)
Potassium: 3.4 mmol/L — ABNORMAL LOW (ref 3.5–5.1)
Sodium: 134 mmol/L — ABNORMAL LOW (ref 135–145)

## 2022-12-29 LAB — CBC WITH DIFFERENTIAL/PLATELET
Abs Immature Granulocytes: 0.03 10*3/uL (ref 0.00–0.07)
Basophils Absolute: 0 10*3/uL (ref 0.0–0.1)
Basophils Relative: 0 %
Eosinophils Absolute: 0 10*3/uL (ref 0.0–0.5)
Eosinophils Relative: 0 %
HCT: 42 % (ref 36.0–46.0)
Hemoglobin: 14.1 g/dL (ref 12.0–15.0)
Immature Granulocytes: 0 %
Lymphocytes Relative: 7 %
Lymphs Abs: 0.6 10*3/uL — ABNORMAL LOW (ref 0.7–4.0)
MCH: 29.4 pg (ref 26.0–34.0)
MCHC: 33.6 g/dL (ref 30.0–36.0)
MCV: 87.7 fL (ref 80.0–100.0)
Monocytes Absolute: 0.5 10*3/uL (ref 0.1–1.0)
Monocytes Relative: 6 %
Neutro Abs: 7.5 10*3/uL (ref 1.7–7.7)
Neutrophils Relative %: 87 %
Platelets: 237 10*3/uL (ref 150–400)
RBC: 4.79 MIL/uL (ref 3.87–5.11)
RDW: 13.2 % (ref 11.5–15.5)
WBC: 8.6 10*3/uL (ref 4.0–10.5)
nRBC: 0 % (ref 0.0–0.2)

## 2022-12-29 LAB — PHOSPHORUS: Phosphorus: 3.3 mg/dL (ref 2.5–4.6)

## 2022-12-29 LAB — TYPE AND SCREEN
ABO/RH(D): O POS
Antibody Screen: NEGATIVE

## 2022-12-29 LAB — MAGNESIUM: Magnesium: 1.9 mg/dL (ref 1.7–2.4)

## 2022-12-29 LAB — PROTIME-INR
INR: 1 (ref 0.8–1.2)
Prothrombin Time: 13.6 seconds (ref 11.4–15.2)

## 2022-12-29 LAB — GLUCOSE, CAPILLARY: Glucose-Capillary: 224 mg/dL — ABNORMAL HIGH (ref 70–99)

## 2022-12-29 MED ORDER — LOSARTAN POTASSIUM 50 MG PO TABS
50.0000 mg | ORAL_TABLET | Freq: Every day | ORAL | Status: DC
  Administered 2022-12-31 – 2023-01-02 (×3): 50 mg via ORAL
  Filled 2022-12-29 (×4): qty 1

## 2022-12-29 MED ORDER — ACETAMINOPHEN 325 MG PO TABS
650.0000 mg | ORAL_TABLET | Freq: Four times a day (QID) | ORAL | Status: DC | PRN
  Administered 2022-12-30: 650 mg via ORAL
  Filled 2022-12-29: qty 2

## 2022-12-29 MED ORDER — LORATADINE 10 MG PO TABS: 10.0000 mg | ORAL_TABLET | Freq: Every day | ORAL | Status: DC | PRN

## 2022-12-29 MED ORDER — OXYCODONE HCL 5 MG PO TABS
5.0000 mg | ORAL_TABLET | Freq: Four times a day (QID) | ORAL | Status: DC | PRN
  Administered 2022-12-29 – 2022-12-31 (×3): 5 mg via ORAL
  Filled 2022-12-29 (×3): qty 1

## 2022-12-29 MED ORDER — INSULIN ASPART 100 UNIT/ML IJ SOLN
0.0000 [IU] | Freq: Three times a day (TID) | INTRAMUSCULAR | Status: DC
  Administered 2022-12-29: 3 [IU] via SUBCUTANEOUS
  Administered 2022-12-30 (×2): 2 [IU] via SUBCUTANEOUS
  Administered 2022-12-30: 1 [IU] via SUBCUTANEOUS
  Administered 2023-01-01 – 2023-01-02 (×4): 2 [IU] via SUBCUTANEOUS
  Administered 2023-01-02: 1 [IU] via SUBCUTANEOUS
  Filled 2022-12-29: qty 0.09

## 2022-12-29 MED ORDER — CLONIDINE HCL (ANALGESIA) 100 MCG/ML EP SOLN
EPIDURAL | Status: DC | PRN
  Administered 2022-12-29: 80 ug

## 2022-12-29 MED ORDER — FENTANYL CITRATE PF 50 MCG/ML IJ SOSY: 50.0000 ug | PREFILLED_SYRINGE | Freq: Once | INTRAMUSCULAR | Status: AC

## 2022-12-29 MED ORDER — ACETAMINOPHEN 650 MG RE SUPP: 650.0000 mg | Freq: Four times a day (QID) | RECTAL | Status: DC | PRN

## 2022-12-29 MED ORDER — AMLODIPINE BESYLATE 5 MG PO TABS
5.0000 mg | ORAL_TABLET | Freq: Every day | ORAL | Status: DC
  Administered 2022-12-31 – 2023-01-02 (×3): 5 mg via ORAL
  Filled 2022-12-29 (×3): qty 1
  Filled 2022-12-29: qty 2

## 2022-12-29 MED ORDER — SENNOSIDES-DOCUSATE SODIUM 8.6-50 MG PO TABS
1.0000 | ORAL_TABLET | Freq: Two times a day (BID) | ORAL | Status: DC
  Administered 2022-12-30 – 2023-01-02 (×7): 1 via ORAL
  Filled 2022-12-29 (×8): qty 1

## 2022-12-29 MED ORDER — ONDANSETRON HCL 4 MG/2ML IJ SOLN: 4.0000 mg | Freq: Four times a day (QID) | INTRAMUSCULAR | Status: DC | PRN

## 2022-12-29 MED ORDER — ROPIVACAINE HCL 5 MG/ML IJ SOLN
INTRAMUSCULAR | Status: DC | PRN
  Administered 2022-12-29: 30 mL via PERINEURAL

## 2022-12-29 MED ORDER — DEXAMETHASONE SODIUM PHOSPHATE 4 MG/ML IJ SOLN
INTRAMUSCULAR | Status: DC | PRN
  Administered 2022-12-29: 8 mg via PERINEURAL

## 2022-12-29 MED ORDER — ONDANSETRON HCL 4 MG/2ML IJ SOLN
4.0000 mg | Freq: Once | INTRAMUSCULAR | Status: AC
  Administered 2022-12-29: 4 mg via INTRAVENOUS
  Filled 2022-12-29: qty 2

## 2022-12-29 MED ORDER — FAMOTIDINE 20 MG PO TABS
20.0000 mg | ORAL_TABLET | Freq: Every day | ORAL | Status: DC
  Administered 2022-12-29 – 2023-01-02 (×5): 20 mg via ORAL
  Filled 2022-12-29 (×5): qty 1

## 2022-12-29 MED ORDER — ONDANSETRON HCL 4 MG PO TABS: 4.0000 mg | ORAL_TABLET | Freq: Four times a day (QID) | ORAL | Status: DC | PRN

## 2022-12-29 MED ORDER — PRAVASTATIN SODIUM 10 MG PO TABS
10.0000 mg | ORAL_TABLET | Freq: Every day | ORAL | Status: DC
  Administered 2022-12-29 – 2022-12-30 (×2): 10 mg via ORAL
  Filled 2022-12-29 (×3): qty 1

## 2022-12-29 MED ORDER — KETOROLAC TROMETHAMINE 15 MG/ML IJ SOLN
15.0000 mg | Freq: Once | INTRAMUSCULAR | Status: AC
  Administered 2022-12-29: 15 mg via INTRAVENOUS
  Filled 2022-12-29: qty 1

## 2022-12-29 MED ORDER — HYDROCHLOROTHIAZIDE 12.5 MG PO TABS
12.5000 mg | ORAL_TABLET | Freq: Every morning | ORAL | Status: DC
  Administered 2022-12-31 – 2023-01-02 (×3): 12.5 mg via ORAL
  Filled 2022-12-29 (×4): qty 1

## 2022-12-29 MED ORDER — OYSTER SHELL CALCIUM/D3 500-5 MG-MCG PO TABS
1.0000 | ORAL_TABLET | Freq: Every day | ORAL | Status: DC
  Administered 2022-12-29 – 2022-12-30 (×2): 1 via ORAL
  Filled 2022-12-29 (×3): qty 1

## 2022-12-29 MED ORDER — MAGNESIUM HYDROXIDE 400 MG/5ML PO SUSP: 30.0000 mL | Freq: Every day | ORAL | Status: DC | PRN

## 2022-12-29 MED ORDER — FENTANYL CITRATE PF 50 MCG/ML IJ SOSY
PREFILLED_SYRINGE | INTRAMUSCULAR | Status: AC
  Administered 2022-12-29: 25 ug via INTRAVENOUS
  Filled 2022-12-29: qty 1

## 2022-12-29 MED ORDER — ASPIRIN 81 MG PO TBEC
81.0000 mg | DELAYED_RELEASE_TABLET | Freq: Every day | ORAL | Status: DC
  Administered 2022-12-29 – 2023-01-02 (×5): 81 mg via ORAL
  Filled 2022-12-29 (×5): qty 1

## 2022-12-29 MED ORDER — PANTOPRAZOLE SODIUM 40 MG PO TBEC
40.0000 mg | DELAYED_RELEASE_TABLET | Freq: Every day | ORAL | Status: DC
  Administered 2022-12-29 – 2023-01-02 (×5): 40 mg via ORAL
  Filled 2022-12-29 (×5): qty 1

## 2022-12-29 MED ORDER — HYDROMORPHONE HCL 1 MG/ML IJ SOLN
0.5000 mg | INTRAMUSCULAR | Status: DC | PRN
  Filled 2022-12-29: qty 0.5

## 2022-12-29 MED ORDER — FENTANYL CITRATE PF 50 MCG/ML IJ SOSY
25.0000 ug | PREFILLED_SYRINGE | Freq: Once | INTRAMUSCULAR | Status: AC
  Administered 2022-12-29: 25 ug via INTRAVENOUS
  Filled 2022-12-29: qty 1

## 2022-12-29 MED ORDER — METFORMIN HCL 500 MG PO TABS
500.0000 mg | ORAL_TABLET | Freq: Every day | ORAL | Status: DC
  Administered 2022-12-30 – 2023-01-02 (×4): 500 mg via ORAL
  Filled 2022-12-29 (×4): qty 1

## 2022-12-29 NOTE — Anesthesia Postprocedure Evaluation (Signed)
Anesthesia Post Note  Patient: Andrea Wong  Procedure(s) Performed: AN AD HOC NERVE BLOCK     Patient location during evaluation: PACU Anesthesia Type: Regional Level of consciousness: awake and alert Pain management: pain level controlled Vital Signs Assessment: post-procedure vital signs reviewed and stable Respiratory status: spontaneous breathing Cardiovascular status: stable Anesthetic complications: no   No notable events documented.  Last Vitals:  Vitals:   12/29/22 1730 12/29/22 1830  BP: (!) 116/57 124/62  Pulse: 69 74  Resp: (!) 22 16  Temp:  37.1 C  SpO2: 95% 97%    Last Pain:  Vitals:   12/29/22 1830  TempSrc: Oral  PainSc:                  [name]

## 2022-12-29 NOTE — ED Notes (Signed)
[facility] here for patient. Patient currently in short stay for nerve block. [facility] staff transporting patient from short stay to [facility]

## 2022-12-29 NOTE — ED Notes (Signed)
[facility] has been called for transport. Will be a delay in transport due to truck availability.

## 2022-12-29 NOTE — ED Notes (Signed)
Patient transported to X-ray 

## 2022-12-29 NOTE — Progress Notes (Signed)
Received patient from [facility] short stay transported by [facility]. Patient is alert and oriented, not in any distress. VSS, oriented patient to room and staff. Will endorse appropriately.

## 2022-12-29 NOTE — ED Notes (Signed)
ED TO INPATIENT HANDOFF REPORT  Name/Age/Gender Steele Sizer 89 y.o. female  Code Status    Code Status Orders  (From admission, onward)           Start     Ordered   12/29/22 1335  Full code  Continuous       Question:  By:  Answer:  Consent: discussion documented in EHR   12/29/22 1334           Code Status History     This patient has a current code status but no historical code status.       Home/SNF/Other Home  Chief Complaint Closed left hip fracture, initial encounter (HCC) [S72.002A]  Level of Care/Admitting Diagnosis ED Disposition     ED Disposition  Admit   Condition  --   Comment  Hospital Area: [facility] [[id number]]  Level of Care: Med-Surg [16]  May admit patient to [facility] or [facility] if equivalent level of care is available:: No  Covid Evaluation: Asymptomatic - no recent exposure (last 10 days) testing not required  Diagnosis: Closed left hip fracture, initial encounter [facility] [[id number]]  Admitting Physician: Bobette Mo [[id number]]  Attending Physician: Bobette Mo [[id number]]  Certification:: I certify this patient will need inpatient services for at least 2 midnights  Estimated Length of Stay: 3          Medical History Past Medical History:  Diagnosis Date   Allergy    seasonal    Cancer (HCC)    breast CA (1970), Uterine CA (2003)   Cataract    B cataracts extracted 2019   Diabetes mellitus without complication (HCC)    GERD (gastroesophageal reflux disease)    Hiatal hernia    Hyperlipidemia    Hypertension    Osteopenia     Allergies Allergies  Allergen Reactions   Codeine     "Hypes" pt up.   Lisinopril Cough    IV Location/Drains/Wounds Patient Lines/Drains/Airways Status     Active Line/Drains/Airways     Name Placement date Placement time Site Days   Peripheral IV 12/29/22 18 G 1.16" Right Antecubital 12/29/22  0916  Antecubital  less than 1             Labs/Imaging Results for orders placed or performed during the hospital encounter of 12/29/22 (from the past 48 hour(s))  CBG monitoring, ED     Status: Abnormal   Collection Time: 12/29/22  8:46 AM  Result Value Ref Range   Glucose-Capillary 159 (H) 70 - 99 mg/dL    Comment: Glucose reference range applies only to samples taken after fasting for at least 8 hours.  Basic metabolic panel     Status: Abnormal   Collection Time: 12/29/22  9:17 AM  Result Value Ref Range   Sodium 134 (L) 135 - 145 mmol/L   Potassium 3.4 (L) 3.5 - 5.1 mmol/L   Chloride 98 98 - 111 mmol/L   CO2 26 22 - 32 mmol/L   Glucose, Bld 156 (H) 70 - 99 mg/dL    Comment: Glucose reference range applies only to samples taken after fasting for at least 8 hours.   BUN 9 8 - 23 mg/dL   Creatinine, Ser 6.57 0.44 - 1.00 mg/dL   Calcium 9.4 8.9 - 84.6 mg/dL   GFR, Estimated >96 >29 mL/min    Comment: (NOTE) Calculated using the CKD-EPI Creatinine Equation (2021)    Anion gap 10 5 - 15  Comment: Performed at [facility], [address] [place], [place] [address]  CBC with Differential     Status: Abnormal   Collection Time: 12/29/22  9:17 AM  Result Value Ref Range   WBC 8.6 4.0 - 10.5 K/uL   RBC 4.79 3.87 - 5.11 MIL/uL   Hemoglobin 14.1 12.0 - 15.0 g/dL   HCT 60.4 54.0 - 98.1 %   MCV 87.7 80.0 - 100.0 fL   MCH 29.4 26.0 - 34.0 pg   MCHC 33.6 30.0 - 36.0 g/dL   RDW 19.1 47.8 - 29.5 %   Platelets 237 150 - 400 K/uL   nRBC 0.0 0.0 - 0.2 %   Neutrophils Relative % 87 %   Neutro Abs 7.5 1.7 - 7.7 K/uL   Lymphocytes Relative 7 %   Lymphs Abs 0.6 (L) 0.7 - 4.0 K/uL   Monocytes Relative 6 %   Monocytes Absolute 0.5 0.1 - 1.0 K/uL   Eosinophils Relative 0 %   Eosinophils Absolute 0.0 0.0 - 0.5 K/uL   Basophils Relative 0 %   Basophils Absolute 0.0 0.0 - 0.1 K/uL   Immature Granulocytes 0 %   Abs Immature Granulocytes 0.03 0.00 - 0.07 K/uL    Comment: Performed at [facility], [address], [place], [place] [address]  Protime-INR     Status: None   Collection Time: 12/29/22  9:17 AM  Result Value Ref Range   Prothrombin Time 13.6 11.4 - 15.2 seconds   INR 1.0 0.8 - 1.2    Comment: (NOTE) INR goal varies based on device and disease states. Performed at [facility], [address], [place], [place] [address]   Type and screen [facility]     Status: None   Collection Time: 12/29/22  9:17 AM  Result Value Ref Range   ABO/RH(D) O POS    Antibody Screen NEG    Sample Expiration 01/01/2023,2359    Extend sample reason      NO TRANSFUSIONS OR PREGNANCY IN THE PAST 3 MONTHS Performed at [facility], [address], [place], [place] [address]   Phosphorus     Status: None   Collection Time: 12/29/22  9:17 AM  Result Value Ref Range   Phosphorus 3.3 2.5 - 4.6 mg/dL    Comment: Performed at [facility], [address] [place], [place] [address]  Magnesium     Status: None   Collection Time: 12/29/22  9:17 AM  Result Value Ref Range   Magnesium 1.9 1.7 - 2.4 mg/dL    Comment: Performed at [facility], [address] [place], [place] [address]   CT Hip Left Wo Contrast  Result Date: 12/29/2022 CLINICAL DATA:  Suspected hip fracture EXAM: CT PELVIS WITHOUT CONTRAST CT hip without contrast TECHNIQUE: Multidetector CT imaging of the pelvis and left hip was performed following the standard protocol without intravenous contrast. RADIATION DOSE REDUCTION: This exam was performed according to the departmental dose-optimization program which includes automated exposure control, adjustment of the mA and/or kV according to patient size and/or use of iterative reconstruction technique. COMPARISON:  X-ray 12/29/2022 FINDINGS: Urinary Tract:  Preserved contours of the urinary bladder. Bowel: Sigmoid colon diverticula. Scattered stool. The visualized small and large  bowel are nondilated. Vascular/Lymphatic: Minimal vascular calcifications along the left and right common iliac arteries. No specific abnormal lymph node enlargement identified. Reproductive:  Uterus is absent.  No adnexal mass. Other:  Motion seen throughout the examination. Anasarca. No free fluid in the pelvis. Musculoskeletal: Minimally angulated and displaced subcapital femoral neck fracture on the left. Small joint effusion. Underlying osteopenia. Degenerative changes also seen of the pelvis including both sacroiliac joints with sclerosis and joint space loss and small osteophytes. Hypertrophic changes along the pubic symphysis. There is also some concentric joint space loss seen of the hip joints. Degenerative changes seen of the lumbar spine at the edge of the imaging field as well. There is a spiculated sclerotic focus along the intertrochanteric region of the right hip consistent with a bone island. IMPRESSION: Minimally angulated and displaced subcapital left femoral neck fracture. Osteopenia with multifocal degenerative changes. Sigmoid colon diverticula. Motion artifact. Electronically Signed   By: Karen Kays M.D.   On: 12/29/2022 11:12   CT PELVIS WO CONTRAST  Result Date: 12/29/2022 CLINICAL DATA:  Suspected hip fracture EXAM: CT PELVIS WITHOUT CONTRAST CT hip without contrast TECHNIQUE: Multidetector CT imaging of the pelvis and left hip was performed following the standard protocol without intravenous contrast. RADIATION DOSE REDUCTION: This exam was performed according to the departmental dose-optimization program which includes automated exposure control, adjustment of the mA and/or kV according to patient size and/or use of iterative reconstruction technique. COMPARISON:  X-ray 12/29/2022 FINDINGS: Urinary Tract:  Preserved contours of the urinary bladder. Bowel: Sigmoid colon diverticula. Scattered stool. The visualized small and large bowel are nondilated. Vascular/Lymphatic: Minimal  vascular calcifications along the left and right common iliac arteries. No specific abnormal lymph node enlargement identified. Reproductive:  Uterus is absent.  No adnexal mass. Other: Motion seen throughout the examination. Anasarca. No free fluid in the pelvis. Musculoskeletal: Minimally angulated and displaced subcapital femoral neck fracture on the left. Small joint effusion. Underlying osteopenia. Degenerative changes also seen of the pelvis including both sacroiliac joints with sclerosis and joint space loss and small osteophytes. Hypertrophic changes along the pubic symphysis. There is also some concentric joint space loss seen of the hip joints. Degenerative changes seen of the lumbar spine at the edge of the imaging field as well. There is a spiculated sclerotic focus along the intertrochanteric region of the right hip consistent with a bone island. IMPRESSION: Minimally angulated and displaced subcapital left femoral neck fracture. Osteopenia with multifocal degenerative changes. Sigmoid colon diverticula. Motion artifact. Electronically Signed   By: Karen Kays M.D.   On: 12/29/2022 11:12   DG Lumbar Spine Complete  Result Date: 12/29/2022 CLINICAL DATA:  Trauma, fall, pain EXAM: LUMBAR SPINE - COMPLETE 4+ VIEW COMPARISON:  None Available. FINDINGS: No recent fracture is seen. Degenerative changes are noted with disc space narrowing and bony spurs at multiple levels. There is minimal anterolisthesis at L4-L5 level. Dextroscoliosis is seen in lumbar spine. Surgical clips are noted in anterior upper and mid abdomen. Few surgical clips are seen in pelvis. IMPRESSION: No recent fracture is seen in the lumbar spine. Lumbar spondylosis, more so from L3-S1 levels. There is possible minimal anterolisthesis at the L4-L5 level. Dextroscoliosis. Electronically Signed   By: Ernie Avena M.D.   On: 12/29/2022 10:02   DG Hip Unilat With Pelvis 2-3 Views Left  Result Date: 12/29/2022 CLINICAL DATA:   Pain after fall EXAM: DG HIP (WITH OR WITHOUT PELVIS) 3V LEFT COMPARISON:  None Available. FINDINGS: There is subtle linear lucency along the femoral neck on the left. Nondisplaced fracture is possible. Recommend confirmatory cross-sectional imaging study. Preserved joint spaces. No additional fracture or dislocation. Osteopenia. Spiculated focus along the intertrochanteric region of  the right hip may be a bone island. IMPRESSION: There is subtle linear lucency along the femoral neck on the left with disturbed trabecula. Nondisplaced fracture is possible. Recommend confirmatory cross-sectional imaging study. Electronically Signed   By: Karen Kays M.D.   On: 12/29/2022 09:59    Pending Labs Unresulted Labs (From admission, onward)     Start     Ordered   12/29/22 1337  ABO/Rh  Once,   R        12/29/22 1337            Vitals/Pain Today's Vitals   12/29/22 0846 12/29/22 0847 12/29/22 0915 12/29/22 1145  BP:   (!) 111/58 (!) 111/56  Pulse:   62 66  Resp:   20 19  Temp:    98 F (36.7 C)  TempSrc:    Oral  SpO2:   92% 93%  Weight:  68 kg    Height:  5\' 4"  (1.626 m)    PainSc: 8    0-No pain    Isolation Precautions No active isolations  Medications Medications  ketorolac (TORADOL) 15 MG/ML injection 15 mg (has no administration in time range)  fentaNYL (SUBLIMAZE) injection 25 mcg (has no administration in time range)  ondansetron (ZOFRAN) injection 4 mg (has no administration in time range)  acetaminophen (TYLENOL) tablet 650 mg (has no administration in time range)    Or  acetaminophen (TYLENOL) suppository 650 mg (has no administration in time range)  ondansetron (ZOFRAN) tablet 4 mg (has no administration in time range)    Or  ondansetron (ZOFRAN) injection 4 mg (has no administration in time range)    Mobility non-ambulatory

## 2022-12-29 NOTE — Anesthesia Preprocedure Evaluation (Signed)
Anesthesia Evaluation  Patient identified by MRN, date of birth, ID band Patient awake    Reviewed: Allergy & Precautions, NPO status , Patient's Chart, lab work & pertinent test results, Unable to perform ROS - Chart review only  History of Anesthesia Complications Negative for: history of anesthetic complications  Airway Mallampati: II  TM Distance: >3 FB Neck ROM: Full    Dental no notable dental hx. (+) Dental Advisory Given   Pulmonary neg pulmonary ROS   Pulmonary exam normal        Cardiovascular hypertension, Normal cardiovascular exam     Neuro/Psych negative neurological ROS     GI/Hepatic Neg liver ROS, hiatal hernia,GERD  ,,  Endo/Other  diabetes    Renal/GU negative Renal ROS     Musculoskeletal negative musculoskeletal ROS (+)    Abdominal   Peds  Hematology negative hematology ROS (+)   Anesthesia Other Findings   Reproductive/Obstetrics                             Anesthesia Physical Anesthesia Plan  ASA: 3  Anesthesia Plan: MAC and Spinal   Post-op Pain Management: Toradol IV (intra-op)* and Tylenol PO (pre-op)*   Induction:   PONV Risk Score and Plan: 3 and Ondansetron, Dexamethasone and Propofol infusion  Airway Management Planned: Natural Airway  Additional Equipment:   Intra-op Plan:   Post-operative Plan:   Informed Consent: I have reviewed the patients History and Physical, chart, labs and discussed the procedure including the risks, benefits and alternatives for the proposed anesthesia with the patient or authorized representative who has indicated his/her understanding and acceptance.     Dental advisory given  Plan Discussed with: CRNA and Anesthesiologist  Anesthesia Plan Comments:         Anesthesia Quick Evaluation

## 2022-12-29 NOTE — Anesthesia Preprocedure Evaluation (Addendum)
Anesthesia Evaluation  Patient identified by MRN, date of birth, ID band Patient awake    Reviewed: Allergy & Precautions, NPO status , Patient's Chart, lab work & pertinent test results  Airway Mallampati: II  TM Distance: >3 FB Neck ROM: Full    Dental  (+) Dental Advisory Given   Pulmonary neg pulmonary ROS   Pulmonary exam normal breath sounds clear to auscultation       Cardiovascular hypertension, Pt. on medications  Rhythm:Regular Rate:Normal     Neuro/Psych negative neurological ROS     GI/Hepatic Neg liver ROS, hiatal hernia,GERD  ,,  Endo/Other  diabetes    Renal/GU negative Renal ROS     Musculoskeletal  (+) Arthritis ,    Abdominal   Peds  Hematology negative hematology ROS (+)   Anesthesia Other Findings   Reproductive/Obstetrics                             Anesthesia Physical Anesthesia Plan  ASA: 3  Anesthesia Plan: Regional   Post-op Pain Management:    Induction:   PONV Risk Score and Plan:   Airway Management Planned:   Additional Equipment:   Intra-op Plan:   Post-operative Plan:   Informed Consent: I have reviewed the patients History and Physical, chart, labs and discussed the procedure including the risks, benefits and alternatives for the proposed anesthesia with the patient or authorized representative who has indicated his/her understanding and acceptance.       Plan Discussed with:   Anesthesia Plan Comments:         Anesthesia Quick Evaluation

## 2022-12-29 NOTE — ED Notes (Signed)
Patient transported to CT 

## 2022-12-29 NOTE — H&P (Signed)
History and Physical    Patient: Andrea Wong ZOX:[id number] DOB: 1934-01-29 DOA: 12/29/2022 DOS: the patient was seen and examined on 12/29/2022 PCP: Clovis Riley, L.August Saucer, MD  Patient coming from: Home  Chief Complaint:  Chief Complaint  Patient presents with   Fall   Hip Pain   HPI: Andrea Wong is a 89 y.o. female with medical history significant of  seasonal allergies, breast cancer, cataracts, type 2 diabetes, GERD, hiatal hernia, hyperlipidemia, hypertension, osteopenia who is brought to the emergency department after having a mechanical fall at home landing on her left hip resulting immediate intense left hip pain and inability to bear weight on her RLE.  No fever, chills or night sweats. No sore throat, rhinorrhea, dyspnea, wheezing or hemoptysis.  No chest pain, palpitations, diaphoresis, PND, orthopnea or pitting edema of the lower extremities.  No appetite changes, abdominal pain, diarrhea, constipation, melena or hematochezia.  No flank pain, dysuria, frequency or hematuria.  No polyuria, polydipsia, polyphagia or blurred vision.  Lab work: Her CBC showed a white count 9.6, hemoglobin 14.1 g/dL platelets 811.  PT 13.6 and INR 1.0.  Normal phosphorus and magnesium.  BMP showed a potassium of 3.4 mmol/L and a glucose of 156 mg/dL.  The rest of the BMP measurements are normal after sodium correction.  Imaging: Hip x-ray with subtle linear lucency along the femoral neck of the left with distal lower trabecula.  Lumbar spine x-ray with no recent fracture in the lumbar spine.  There was lumbar spondylosis more so from L3-S1 levels.  Possible minimal anterolisthesis of the L4-L5 level.  Dextroscoliosis.  CT of the hip without contrast shows minimally angulated and displaced subcapital left femoral neck fracture.  Osteopenia with multifocal degenerative changes.  CT of the pelvis without contrast sigmoid colon diverticula.  ED course: Initial vital signs were temperature 98.1 F, pulse 64,  respiration 19, BP 117/56 mmHg O2 sat 94% on room air.  No medications were given in the emergency department.  I added fentanyl 25 mcg IVP, ondansetron 4 mg IVP and ketorolac 15 mg IVP.   Review of Systems: As mentioned in the history of present illness. All other systems reviewed and are negative. Past Medical History:  Diagnosis Date   Allergy    seasonal    Cancer (HCC)    breast CA (1970), Uterine CA (2003)   Cataract    B cataracts extracted 2019   Diabetes mellitus without complication (HCC)    GERD (gastroesophageal reflux disease)    Hiatal hernia    Hyperlipidemia    Hypertension    Osteopenia    Past Surgical History:  Procedure Laterality Date   BREAST BIOPSY     BREAST LUMPECTOMY Left    1970   Social History:  reports that she has never smoked. She has never used smokeless tobacco. She reports current alcohol use of about 2.0 standard drinks of alcohol per week. She reports that she does not use drugs.  Allergies  Allergen Reactions   Codeine     "Hypes" pt up.   Lisinopril Cough    History reviewed. No pertinent family history.  Prior to Admission medications   Medication Sig Start Date End Date Taking? Authorizing Provider  aspirin EC 81 MG tablet Take 81 mg by mouth daily.    [provider]  calcium-vitamin D 250-100 MG-UNIT tablet Take 1 tablet by mouth 2 (two) times daily.    [provider]  diclofenac (VOLTAREN) 75 MG EC tablet Take 1  tablet (75 mg total) by mouth 2 (two) times daily as needed. 08/31/20   Cristie Hem, PA-C  fexofenadine (ALLEGRA) 180 MG tablet Take 180 mg by mouth daily.    [provider]  fluticasone (VERAMYST) 27.5 MCG/SPRAY nasal spray Place 2 sprays into the nose daily.    [provider]  HYDROcodone-acetaminophen (NORCO) 5-325 MG tablet Take 1 tablet by mouth 2 (two) times daily as needed for moderate pain. 09/14/20   Cristie Hem, PA-C  losartan (COZAAR) 50 MG tablet Take 50 mg by mouth  daily.    [provider]  meloxicam (MOBIC) 7.5 MG tablet Take 1 tablet (7.5 mg total) by mouth 2 (two) times daily as needed for pain. 01/03/20   Tarry Kos, MD  Multiple Vitamin (MULTIVITAMIN) LIQD Take 5 mLs by mouth daily.    [provider]  Multiple Vitamin (MULTIVITAMIN) tablet Take 1 tablet by mouth daily.    [provider]  Omega-3 Fatty Acids (FISH OIL) 1000 MG CAPS Take by mouth.    [provider]  omeprazole (PRILOSEC) 20 MG capsule Take 20 mg by mouth 2 (two) times daily before a meal.    [provider]  pravastatin (PRAVACHOL) 10 MG tablet Take 10 mg by mouth daily.    [provider]  predniSONE (STERAPRED UNI-PAK 21 TAB) 10 MG (21) TBPK tablet Take as directed 08/21/20   Tarry Kos, MD  traMADol (ULTRAM) 50 MG tablet Take 1 tablet (50 mg total) by mouth 3 (three) times daily as needed. 09/05/20   Cristie Hem, PA-C    Physical Exam: Vitals:   12/29/22 0845 12/29/22 0847 12/29/22 0915 12/29/22 1145  BP: (!) 117/56  (!) 111/58 (!) 111/56  Pulse: 64  62 66  Resp: 20  20 19   Temp:    98 F (36.7 C)  TempSrc:    Oral  SpO2: 91%  92% 93%  Weight:  68 kg    Height:  5\' 4"  (1.626 m)     Physical Exam Vitals and nursing note reviewed.  Constitutional:      General: She is awake. She is not in acute distress.    Appearance: Normal appearance.  HENT:     Head: Normocephalic.     Nose: No rhinorrhea.     Mouth/Throat:     Mouth: Mucous membranes are moist.  Eyes:     General: No scleral icterus.    Pupils: Pupils are equal, round, and reactive to light.  Neck:     Vascular: No JVD.  Cardiovascular:     Rate and Rhythm: Normal rate and regular rhythm.     Heart sounds: S1 normal and S2 normal.  Pulmonary:     Effort: Pulmonary effort is normal.     Breath sounds: Normal breath sounds.  Abdominal:     General: Bowel sounds are normal.     Palpations: Abdomen is soft.  Musculoskeletal:     Cervical  back: Neck supple.     Left hip: Tenderness present. Decreased range of motion.     Right lower leg: No edema.     Left lower leg: No edema.  Skin:    General: Skin is warm and dry.  Neurological:     General: No focal deficit present.     Mental Status: She is alert and oriented to person, place, and time.  Psychiatric:        Mood and Affect: Mood normal.  Behavior: Behavior normal. Behavior is cooperative.     Data Reviewed:  Results are pending, will review when available.  Assessment and Plan: Principal Problem:   Closed left hip fracture, initial encounter (HCC) In the setting of history of:   Osteopenia Admit to telemetry/inpatient. Ice area as needed. Buck's traction per protocol. Analgesics as needed. Antiemetics as needed. Consult TOC team. Consult nutritional services. PT evaluation after surgery. Orthopedic surgery will evaluate. They requested admission at [facility].  Active Problems:   Essential hypertension Continue amlodipine 5 mg p.o. daily. Continue hydrochlorothiazide 12.5 mg p.o. daily. Continue losartan 50 mg p.o. daily.    Gastroesophageal reflux disease Continue pantoprazole 40 mg p.o. daily. Continue famotidine 20 mg p.o. daily.    Hypercholesterolemia Continue pravastatin 10 mg p.o. daily.    Type 2 diabetes mellitus (HCC) Carbohydrate modified diet. Continue metformin 500 mg before breakfast. CBG monitoring with [facility] while in the hospital.     Advance Care Planning:   Code Status: Full Code   Consults: Orthopedic surgery (Dr. Roda Shutters).  Family Communication: Her spouse and daughter were at bedside.  Severity of Illness: The appropriate patient status for this patient is INPATIENT. Inpatient status is judged to be reasonable and necessary in order to provide the required intensity of service to ensure the patient's safety. The patient's presenting symptoms, physical exam findings, and initial radiographic and laboratory data in the  context of their chronic comorbidities is felt to place them at high risk for further clinical deterioration. Furthermore, it is not anticipated that the patient will be medically stable for discharge from the hospital within 2 midnights of admission.   * I certify that at the point of admission it is my clinical judgment that the patient will require inpatient hospital care spanning beyond 2 midnights from the point of admission due to high intensity of service, high risk for further deterioration and high frequency of surveillance required.*  Author: Bobette Mo, MD 12/29/2022 12:36 PM  For on call review [email or web address].   This document was prepared using Dragon voice recognition software and may contain some unintended transcription errors.

## 2022-12-29 NOTE — ED Notes (Signed)
Notified Dr. Robb Matar patient BP 109/59 instructed to hold BP medications

## 2022-12-29 NOTE — Progress Notes (Signed)
Received consult from Dr. Lynelle Doctor.  Andrea Wong is a patient of mine from the past who suffered a left femoral neck fracture.  I will plan to perform surgery on Wednesday given scheduling constraints.  I have [facility] that [facility] point before Wednesday for surgery.  I will talk to the patient and provide official consult in the next day.  She can have a diet today and tomorrow.    Mayra Reel, MD [facility] 1:44 PM

## 2022-12-29 NOTE — Anesthesia Procedure Notes (Signed)
Anesthesia Regional Block: Femoral nerve block   Pre-Anesthetic Checklist: , timeout performed,  Correct Patient, Correct Site, Correct Laterality,  Correct Procedure, Correct Position, site marked,  Risks and benefits discussed,  Surgical consent,  Pre-op evaluation,  At surgeon's request and post-op pain management  Laterality: Lower and Left  Prep: chloraprep       Needles:  Injection technique: Single-shot  Needle Type: Stimulator Needle - 80     Needle Length: 9cm  Needle Gauge: 22   Needle insertion depth: 6 cm   Additional Needles:   Procedures:, nerve stimulator,,, ultrasound used (permanent image in chart),,     Nerve Stimulator or Paresthesia:  Response: Patellar snap, 0.5 mA  Additional Responses:   Narrative:  Start time: 12/29/2022 5:07 PM End time: 12/29/2022 5:30 PM Injection made incrementally with aspirations every 5 mL.  Performed by: Personally  Anesthesiologist: [name], MD  Additional Notes: BP cuff, EKG monitors applied. Sedation begun. Femoral artery palpated for location of nerve. After nerve location verified with U/S, anesthetic injected incrementally, slowly, and after negative aspirations under direct u/s guidance. Good perineural spread. Patient tolerated well.

## 2022-12-29 NOTE — ED Triage Notes (Signed)
Patient had fall last night. Patient is ambulatory with walker/cane. Patient states she was in the kitchen went to turn around foot got caught up in walker and she feel onto her left hip. Patient denies any head injury or LOC. Patient c/o pain to left hip. Patient unable to bear any weight on left leg

## 2022-12-29 NOTE — ED Provider Notes (Signed)
[place] EMERGENCY DEPARTMENT AT [facility] Provider Note   CSN: [id number] Arrival date & time: 12/29/22  0830     History {Add pertinent medical, surgical, social history, OB history to HPI:1} Chief Complaint  Patient presents with   Fall   Hip Pain    Andrea Wong is a 89 y.o. female.   Fall  Hip Pain     Patient has a history of diabetes, hypertension, hyperlipidemia, reflux, breast cancer, arthritis and lumbar radiculopathy.  Patient presents ED for evaluation of left hip pain after fall.  Patient was walking in the kitchen with her walker.  Her foot got caught when she was trying to turn and she ended up falling.  Patient landed on her left side.  Patient was unable to get up on her own family had to help her.  This morning they called EMS to bring her to the ED for evaluation with her persistent hip pain.  Patient denies any headache.  No chest pain or shortness of breath.  No abdominal pain.  The pain is in her left hip and goes down her leg.  Home Medications Prior to Admission medications   Medication Sig Start Date End Date Taking? Authorizing Provider  aspirin EC 81 MG tablet Take 81 mg by mouth daily.    [provider]  calcium-vitamin D 250-100 MG-UNIT tablet Take 1 tablet by mouth 2 (two) times daily.    [provider]  diclofenac (VOLTAREN) 75 MG EC tablet Take 1 tablet (75 mg total) by mouth 2 (two) times daily as needed. 08/31/20   Cristie Hem, PA-C  fexofenadine (ALLEGRA) 180 MG tablet Take 180 mg by mouth daily.    [provider]  fluticasone (VERAMYST) 27.5 MCG/SPRAY nasal spray Place 2 sprays into the nose daily.    [provider]  HYDROcodone-acetaminophen (NORCO) 5-325 MG tablet Take 1 tablet by mouth 2 (two) times daily as needed for moderate pain. 09/14/20   Cristie Hem, PA-C  losartan (COZAAR) 50 MG tablet Take 50 mg by mouth daily.    [provider]  meloxicam (MOBIC) 7.5 MG  tablet Take 1 tablet (7.5 mg total) by mouth 2 (two) times daily as needed for pain. 01/03/20   Tarry Kos, MD  Multiple Vitamin (MULTIVITAMIN) LIQD Take 5 mLs by mouth daily.    [provider]  Multiple Vitamin (MULTIVITAMIN) tablet Take 1 tablet by mouth daily.    [provider]  Omega-3 Fatty Acids (FISH OIL) 1000 MG CAPS Take by mouth.    [provider]  omeprazole (PRILOSEC) 20 MG capsule Take 20 mg by mouth 2 (two) times daily before a meal.    [provider]  pravastatin (PRAVACHOL) 10 MG tablet Take 10 mg by mouth daily.    [provider]  predniSONE (STERAPRED UNI-PAK 21 TAB) 10 MG (21) TBPK tablet Take as directed 08/21/20   Tarry Kos, MD  traMADol (ULTRAM) 50 MG tablet Take 1 tablet (50 mg total) by mouth 3 (three) times daily as needed. 09/05/20   Cristie Hem, PA-C      Allergies    Codeine and Lisinopril    Review of Systems   Review of Systems  Physical Exam Updated Vital Signs BP (!) 111/56 (BP Location: Right Arm)   Pulse 66   Temp 98 F (36.7 C) (Oral)   Resp 20   Ht 1.626 m (5\' 4" )   Wt 68 kg  SpO2 93%   BMI 25.75 kg/m  Physical Exam Vitals and nursing note reviewed.  Constitutional:      General: She is not in acute distress.    Appearance: She is well-developed.  HENT:     Head: Normocephalic and atraumatic.     Right Ear: External ear normal.     Left Ear: External ear normal.  Eyes:     General: No scleral icterus.       Right eye: No discharge.        Left eye: No discharge.     Conjunctiva/sclera: Conjunctivae normal.  Neck:     Trachea: No tracheal deviation.  Cardiovascular:     Rate and Rhythm: Normal rate and regular rhythm.  Pulmonary:     Effort: Pulmonary effort is normal. No respiratory distress.     Breath sounds: Normal breath sounds. No stridor.  Abdominal:     General: Abdomen is flat. There is no distension.     Tenderness: There is no abdominal tenderness.   Musculoskeletal:        General: No swelling or deformity.     Cervical back: Normal and neck supple. No tenderness.     Thoracic back: Normal. No tenderness.     Lumbar back: Normal. No tenderness.     Left hip: Tenderness present. Decreased range of motion.     Left upper leg: No swelling.     Left knee: Normal.     Left lower leg: Normal.  Skin:    General: Skin is warm and dry.     Findings: No rash.  Neurological:     Mental Status: She is alert. Mental status is at baseline.     Cranial Nerves: No dysarthria or facial asymmetry.     Motor: No seizure activity.     ED Results / Procedures / Treatments   Labs (all labs ordered are listed, but only abnormal results are displayed) Labs Reviewed  BASIC METABOLIC PANEL - Abnormal; Notable for the following components:      Result Value   Sodium 134 (*)    Potassium 3.4 (*)    Glucose, Bld 156 (*)    All other components within normal limits  CBC WITH DIFFERENTIAL/PLATELET - Abnormal; Notable for the following components:   Lymphs Abs 0.6 (*)    All other components within normal limits  CBG MONITORING, ED - Abnormal; Notable for the following components:   Glucose-Capillary 159 (*)    All other components within normal limits  PROTIME-INR  TYPE AND SCREEN    EKG EKG Interpretation  Date/Time:  Monday Dec 29 2022 08:40:32 EDT Ventricular Rate:  64 PR Interval:  169 QRS Duration: 129 QT Interval:  425 QTC Calculation: 439 R Axis:   48 Text Interpretation: Sinus rhythm Consider right atrial enlargement Right bundle branch block No old tracing to compare Confirmed by Linwood Dibbles [phone]) on 12/29/2022 9:21:54 AM  Radiology CT Hip Left Wo Contrast  Result Date: 12/29/2022 CLINICAL DATA:  Suspected hip fracture EXAM: CT PELVIS WITHOUT CONTRAST CT hip without contrast TECHNIQUE: Multidetector CT imaging of the pelvis and left hip was performed following the standard protocol without intravenous contrast. RADIATION DOSE  REDUCTION: This exam was performed according to the departmental dose-optimization program which includes automated exposure control, adjustment of the mA and/or kV according to patient size and/or use of iterative reconstruction technique. COMPARISON:  X-ray 12/29/2022 FINDINGS: Urinary Tract:  Preserved contours of the urinary bladder. Bowel: Sigmoid colon diverticula. Scattered  stool. The visualized small and large bowel are nondilated. Vascular/Lymphatic: Minimal vascular calcifications along the left and right common iliac arteries. No specific abnormal lymph node enlargement identified. Reproductive:  Uterus is absent.  No adnexal mass. Other: Motion seen throughout the examination. Anasarca. No free fluid in the pelvis. Musculoskeletal: Minimally angulated and displaced subcapital femoral neck fracture on the left. Small joint effusion. Underlying osteopenia. Degenerative changes also seen of the pelvis including both sacroiliac joints with sclerosis and joint space loss and small osteophytes. Hypertrophic changes along the pubic symphysis. There is also some concentric joint space loss seen of the hip joints. Degenerative changes seen of the lumbar spine at the edge of the imaging field as well. There is a spiculated sclerotic focus along the intertrochanteric region of the right hip consistent with a bone island. IMPRESSION: Minimally angulated and displaced subcapital left femoral neck fracture. Osteopenia with multifocal degenerative changes. Sigmoid colon diverticula. Motion artifact. Electronically Signed   By: Karen Kays M.D.   On: 12/29/2022 11:12   CT PELVIS WO CONTRAST  Result Date: 12/29/2022 CLINICAL DATA:  Suspected hip fracture EXAM: CT PELVIS WITHOUT CONTRAST CT hip without contrast TECHNIQUE: Multidetector CT imaging of the pelvis and left hip was performed following the standard protocol without intravenous contrast. RADIATION DOSE REDUCTION: This exam was performed according to the  departmental dose-optimization program which includes automated exposure control, adjustment of the mA and/or kV according to patient size and/or use of iterative reconstruction technique. COMPARISON:  X-ray 12/29/2022 FINDINGS: Urinary Tract:  Preserved contours of the urinary bladder. Bowel: Sigmoid colon diverticula. Scattered stool. The visualized small and large bowel are nondilated. Vascular/Lymphatic: Minimal vascular calcifications along the left and right common iliac arteries. No specific abnormal lymph node enlargement identified. Reproductive:  Uterus is absent.  No adnexal mass. Other: Motion seen throughout the examination. Anasarca. No free fluid in the pelvis. Musculoskeletal: Minimally angulated and displaced subcapital femoral neck fracture on the left. Small joint effusion. Underlying osteopenia. Degenerative changes also seen of the pelvis including both sacroiliac joints with sclerosis and joint space loss and small osteophytes. Hypertrophic changes along the pubic symphysis. There is also some concentric joint space loss seen of the hip joints. Degenerative changes seen of the lumbar spine at the edge of the imaging field as well. There is a spiculated sclerotic focus along the intertrochanteric region of the right hip consistent with a bone island. IMPRESSION: Minimally angulated and displaced subcapital left femoral neck fracture. Osteopenia with multifocal degenerative changes. Sigmoid colon diverticula. Motion artifact. Electronically Signed   By: Karen Kays M.D.   On: 12/29/2022 11:12   DG Lumbar Spine Complete  Result Date: 12/29/2022 CLINICAL DATA:  Trauma, fall, pain EXAM: LUMBAR SPINE - COMPLETE 4+ VIEW COMPARISON:  None Available. FINDINGS: No recent fracture is seen. Degenerative changes are noted with disc space narrowing and bony spurs at multiple levels. There is minimal anterolisthesis at L4-L5 level. Dextroscoliosis is seen in lumbar spine. Surgical clips are noted in  anterior upper and mid abdomen. Few surgical clips are seen in pelvis. IMPRESSION: No recent fracture is seen in the lumbar spine. Lumbar spondylosis, more so from L3-S1 levels. There is possible minimal anterolisthesis at the L4-L5 level. Dextroscoliosis. Electronically Signed   By: Ernie Avena M.D.   On: 12/29/2022 10:02   DG Hip Unilat With Pelvis 2-3 Views Left  Result Date: 12/29/2022 CLINICAL DATA:  Pain after fall EXAM: DG HIP (WITH OR WITHOUT PELVIS) 3V LEFT COMPARISON:  None Available.  FINDINGS: There is subtle linear lucency along the femoral neck on the left. Nondisplaced fracture is possible. Recommend confirmatory cross-sectional imaging study. Preserved joint spaces. No additional fracture or dislocation. Osteopenia. Spiculated focus along the intertrochanteric region of the right hip may be a bone island. IMPRESSION: There is subtle linear lucency along the femoral neck on the left with disturbed trabecula. Nondisplaced fracture is possible. Recommend confirmatory cross-sectional imaging study. Electronically Signed   By: Karen Kays M.D.   On: 12/29/2022 09:59    Procedures Procedures  {Document cardiac monitor, telemetry assessment procedure when appropriate:1}  Medications Ordered in ED Medications - No data to display  ED Course/ Medical Decision Making/ A&P Clinical Course as of 12/29/22 1233  Mon Dec 29, 2022  1044 Plain films concerning for possible femoral neck fracture.  CT scan recommended [JK]  1045 No signs of lumbar spine fracture.  Chronic changes noted [JK]  1206 CT scan shows minimally angulated and displaced subcapital left femoral neck fracture [JK]    Clinical Course User Index [JK] Linwood Dibbles, MD   {   Click here for ABCD2, HEART and other calculatorsREFRESH Note before signing :1}                          Medical Decision Making Amount and/or Complexity of Data Reviewed Labs: ordered. Radiology: ordered.   ***  {Document critical care  time when appropriate:1} {Document review of labs and clinical decision tools ie heart score, Chads2Vasc2 etc:1}  {Document your independent review of radiology images, and any outside records:1} {Document your discussion with family members, caretakers, and with consultants:1} {Document social determinants of health affecting pt's care:1} {Document your decision making why or why not admission, treatments were needed:1} Final Clinical Impression(s) / ED Diagnoses Final diagnoses:  None    Rx / DC Orders ED Discharge Orders     None

## 2022-12-30 DIAGNOSIS — S72002A Fracture of unspecified part of neck of left femur, initial encounter for closed fracture: Secondary | ICD-10-CM | POA: Diagnosis not present

## 2022-12-30 LAB — TYPE AND SCREEN
ABO/RH(D): O POS
Antibody Screen: NEGATIVE

## 2022-12-30 LAB — HEMOGLOBIN A1C
Hgb A1c MFr Bld: 6.1 % — ABNORMAL HIGH (ref 4.8–5.6)
Hgb A1c MFr Bld: 6.1 % — ABNORMAL HIGH (ref 4.8–5.6)
Mean Plasma Glucose: 128.37 mg/dL
Mean Plasma Glucose: 128.37 mg/dL

## 2022-12-30 LAB — GLUCOSE, CAPILLARY
Glucose-Capillary: 155 mg/dL — ABNORMAL HIGH (ref 70–99)
Glucose-Capillary: 133 mg/dL — ABNORMAL HIGH (ref 70–99)
Glucose-Capillary: 155 mg/dL — ABNORMAL HIGH (ref 70–99)
Glucose-Capillary: 203 mg/dL — ABNORMAL HIGH (ref 70–99)

## 2022-12-30 MED ORDER — ADULT MULTIVITAMIN W/MINERALS CH
1.0000 | ORAL_TABLET | Freq: Every day | ORAL | Status: DC
  Administered 2022-12-30 – 2023-01-02 (×4): 1 via ORAL
  Filled 2022-12-30 (×4): qty 1

## 2022-12-30 MED ORDER — ENSURE ENLIVE PO LIQD
237.0000 mL | Freq: Two times a day (BID) | ORAL | Status: DC
  Administered 2022-12-30 – 2023-01-01 (×2): 237 mL via ORAL

## 2022-12-30 MED ORDER — TRANEXAMIC ACID 1000 MG/10ML IV SOLN
2000.0000 mg | INTRAVENOUS | Status: DC
  Filled 2022-12-30: qty 20

## 2022-12-30 NOTE — Consult Note (Signed)
ORTHOPAEDIC CONSULTATION  REQUESTING PHYSICIAN: Dorcas Carrow, MD  Chief Complaint: Left femoral neck fracture  HPI: Andrea Wong is a 89 y.o. female with medical history significant of  seasonal allergies, breast cancer, cataracts, type 2 diabetes, GERD, hiatal hernia, hyperlipidemia, hypertension, osteopenia who is brought to the emergency department after having a mechanical fall at home landing on her left hip resulting immediate intense left hip pain and inability to bear weight on her RLE.  No fever, chills or night sweats. No sore throat, rhinorrhea, dyspnea, wheezing or hemoptysis.  No chest pain, palpitations, diaphoresis, PND, orthopnea or pitting edema of the lower extremities.  No appetite changes, abdominal pain, diarrhea, constipation, melena or hematochezia.  No flank pain, dysuria, frequency or hematuria.  No polyuria, polydipsia, polyphagia or blurred vision.  Ortho consulted for surgical evaluation.  Past Medical History:  Diagnosis Date   Allergy    seasonal    Cancer (HCC)    breast CA (1970), Uterine CA (2003)   Cataract    B cataracts extracted 2019   Diabetes mellitus without complication (HCC)    GERD (gastroesophageal reflux disease)    Hiatal hernia    Hyperlipidemia    Hypertension    Osteopenia    Past Surgical History:  Procedure Laterality Date   BREAST BIOPSY     BREAST LUMPECTOMY Left    1970   Social History   Socioeconomic History   Marital status: Married    Spouse name: Not on file   Number of children: Not on file   Years of education: Not on file   Highest education level: Not on file  Occupational History   Not on file  Tobacco Use   Smoking status: Never   Smokeless tobacco: Never  Substance and Sexual Activity   Alcohol use: Yes    Alcohol/week: 2.0 standard drinks of alcohol    Types: 2 Glasses of wine per week   Drug use: Never   Sexual activity: Not on file  Other Topics Concern   Not on file  Social History  Narrative   Not on file   Social Determinants of Health   Financial Resource Strain: Not on file  Food Insecurity: Not on file  Transportation Needs: Not on file  Physical Activity: Not on file  Stress: Not on file  Social Connections: Not on file   History reviewed. No pertinent family history. Allergies  Allergen Reactions   Codeine     "Hypes" pt up.   Lisinopril Cough   Prior to Admission medications   Medication Sig Start Date End Date Taking? Authorizing Provider  amLODipine (NORVASC) 5 MG tablet Take 5 mg by mouth daily.   Yes [provider]  aspirin EC 81 MG tablet Take 81 mg by mouth daily.   Yes [provider]  calcium-vitamin D 250-100 MG-UNIT tablet Take 1 tablet by mouth 2 (two) times daily.   Yes [provider]  famotidine (PEPCID) 20 MG tablet Take 20 mg by mouth daily. 12/16/22  Yes [provider]  fexofenadine (ALLEGRA) 180 MG tablet Take 180 mg by mouth daily as needed for allergies.   Yes [provider]  fluticasone (VERAMYST) 27.5 MCG/SPRAY nasal spray Place 2 sprays into the nose daily.   Yes [provider]  hydrochlorothiazide (HYDRODIURIL) 12.5 MG tablet Take 12.5 mg by mouth every morning.   Yes [provider]  losartan (COZAAR) 50 MG tablet Take 50 mg by mouth daily.   Yes [provider]  metFORMIN (GLUCOPHAGE) 500 MG tablet Take 500 mg by mouth daily.   Yes [provider]  Multiple Vitamin (MULTIVITAMIN) tablet Take 1 tablet by mouth daily.   Yes [provider]  Omega-3 Fatty Acids (FISH OIL) 1000 MG CAPS Take by mouth.   Yes [provider]  omeprazole (PRILOSEC) 20 MG capsule Take 20 mg by mouth 2 (two) times daily before a meal.   Yes [provider]  pantoprazole (PROTONIX) 40 MG tablet Take 40 mg by mouth daily.   Yes [provider]  pravastatin (PRAVACHOL) 10 MG tablet Take 10 mg by mouth daily.   Yes [provider]   diclofenac (VOLTAREN) 75 MG EC tablet Take 1 tablet (75 mg total) by mouth 2 (two) times daily as needed. Patient not taking: Reported on 12/29/2022 08/31/20   Cristie Hem, PA-C  HYDROcodone-acetaminophen [facility]) 5-325 MG tablet Take 1 tablet by mouth 2 (two) times daily as needed for moderate pain. Patient not taking: Reported on 12/29/2022 09/14/20   Cristie Hem, PA-C  meloxicam (MOBIC) 7.5 MG tablet Take 1 tablet (7.5 mg total) by mouth 2 (two) times daily as needed for pain. Patient not taking: Reported on 12/29/2022 01/03/20   Tarry Kos, MD  predniSONE (STERAPRED UNI-PAK 21 TAB) 10 MG (21) TBPK tablet Take as directed Patient not taking: Reported on 12/29/2022 08/21/20   Tarry Kos, MD  traMADol (ULTRAM) 50 MG tablet Take 1 tablet (50 mg total) by mouth 3 (three) times daily as needed. Patient not taking: Reported on 12/29/2022 09/05/20   Cristie Hem, PA-C   CT Hip Left Wo Contrast  Result Date: 12/29/2022 CLINICAL DATA:  Suspected hip fracture EXAM: CT PELVIS WITHOUT CONTRAST CT hip without contrast TECHNIQUE: Multidetector CT imaging of the pelvis and left hip was performed following the standard protocol without intravenous contrast. RADIATION DOSE REDUCTION: This exam was performed according to the departmental dose-optimization program which includes automated exposure control, adjustment of the mA and/or kV according to patient size and/or use of iterative reconstruction technique. COMPARISON:  X-ray 12/29/2022 FINDINGS: Urinary Tract:  Preserved contours of the urinary bladder. Bowel: Sigmoid colon diverticula. Scattered stool. The visualized small and large bowel are nondilated. Vascular/Lymphatic: Minimal vascular calcifications along the left and right common iliac arteries. No specific abnormal lymph node enlargement identified. Reproductive:  Uterus is absent.  No adnexal mass. Other: Motion seen throughout the examination. Anasarca. No free fluid in the pelvis.  Musculoskeletal: Minimally angulated and displaced subcapital femoral neck fracture on the left. Small joint effusion. Underlying osteopenia. Degenerative changes also seen of the pelvis including both sacroiliac joints with sclerosis and joint space loss and small osteophytes. Hypertrophic changes along the pubic symphysis. There is also some concentric joint space loss seen of the hip joints. Degenerative changes seen of the lumbar spine at the edge of the imaging field as well. There is a spiculated sclerotic focus along the intertrochanteric region of the right hip consistent with a bone island. IMPRESSION: Minimally angulated and displaced subcapital left femoral neck fracture. Osteopenia with multifocal degenerative changes. Sigmoid colon diverticula. Motion artifact. Electronically Signed   By: Karen Kays M.D.   On: 12/29/2022 11:12   CT PELVIS WO CONTRAST  Result Date: 12/29/2022 CLINICAL DATA:  Suspected hip fracture EXAM: CT PELVIS WITHOUT CONTRAST CT hip without contrast TECHNIQUE: Multidetector CT imaging of the pelvis and left hip was performed following the standard protocol without intravenous contrast. RADIATION DOSE REDUCTION: This  exam was performed according to the departmental dose-optimization program which includes automated exposure control, adjustment of the mA and/or kV according to patient size and/or use of iterative reconstruction technique. COMPARISON:  X-ray 12/29/2022 FINDINGS: Urinary Tract:  Preserved contours of the urinary bladder. Bowel: Sigmoid colon diverticula. Scattered stool. The visualized small and large bowel are nondilated. Vascular/Lymphatic: Minimal vascular calcifications along the left and right common iliac arteries. No specific abnormal lymph node enlargement identified. Reproductive:  Uterus is absent.  No adnexal mass. Other: Motion seen throughout the examination. Anasarca. No free fluid in the pelvis. Musculoskeletal: Minimally angulated and displaced  subcapital femoral neck fracture on the left. Small joint effusion. Underlying osteopenia. Degenerative changes also seen of the pelvis including both sacroiliac joints with sclerosis and joint space loss and small osteophytes. Hypertrophic changes along the pubic symphysis. There is also some concentric joint space loss seen of the hip joints. Degenerative changes seen of the lumbar spine at the edge of the imaging field as well. There is a spiculated sclerotic focus along the intertrochanteric region of the right hip consistent with a bone island. IMPRESSION: Minimally angulated and displaced subcapital left femoral neck fracture. Osteopenia with multifocal degenerative changes. Sigmoid colon diverticula. Motion artifact. Electronically Signed   By: Karen Kays M.D.   On: 12/29/2022 11:12   DG Lumbar Spine Complete  Result Date: 12/29/2022 CLINICAL DATA:  Trauma, fall, pain EXAM: LUMBAR SPINE - COMPLETE 4+ VIEW COMPARISON:  None Available. FINDINGS: No recent fracture is seen. Degenerative changes are noted with disc space narrowing and bony spurs at multiple levels. There is minimal anterolisthesis at L4-L5 level. Dextroscoliosis is seen in lumbar spine. Surgical clips are noted in anterior upper and mid abdomen. Few surgical clips are seen in pelvis. IMPRESSION: No recent fracture is seen in the lumbar spine. Lumbar spondylosis, more so from L3-S1 levels. There is possible minimal anterolisthesis at the L4-L5 level. Dextroscoliosis. Electronically Signed   By: Ernie Avena M.D.   On: 12/29/2022 10:02   DG Hip Unilat With Pelvis 2-3 Views Left  Result Date: 12/29/2022 CLINICAL DATA:  Pain after fall EXAM: DG HIP (WITH OR WITHOUT PELVIS) 3V LEFT COMPARISON:  None Available. FINDINGS: There is subtle linear lucency along the femoral neck on the left. Nondisplaced fracture is possible. Recommend confirmatory cross-sectional imaging study. Preserved joint spaces. No additional fracture or dislocation.  Osteopenia. Spiculated focus along the intertrochanteric region of the right hip may be a bone island. IMPRESSION: There is subtle linear lucency along the femoral neck on the left with disturbed trabecula. Nondisplaced fracture is possible. Recommend confirmatory cross-sectional imaging study. Electronically Signed   By: Karen Kays M.D.   On: 12/29/2022 09:59    All pertinent xrays, MRI, CT independently reviewed and interpreted  Positive ROS: All other systems have been reviewed and were otherwise negative with the exception of those mentioned in the HPI and as above.  Physical Exam: General: No acute distress Cardiovascular: No pedal edema Respiratory: No cyanosis, no use of accessory musculature GI: No organomegaly, abdomen is soft and non-tender Skin: No lesions in the area of chief complaint Neurologic: Sensation intact distally Psychiatric: Patient is at baseline mood and affect Lymphatic: No axillary or cervical lymphadenopathy  MUSCULOSKELETAL:  - severe pain with movement of the hip and extremity - skin intact - NVI distally - compartments soft  Assessment: Left femoral neck fracture  Plan: - surgical treatment is recommended for pain relief, quality of life and early mobilization - patient and  family are aware of r/b/a and wish to proceed, informed consent obtained - medical optimization per primary team - surgery is planned for tomorrow afternoon - patient can remain on baby aspirin - NPO after midnight  Thank you for the consult and the opportunity to see Ms. Chisum  N. Glee Arvin, MD [facility] 7:45 AM

## 2022-12-30 NOTE — Progress Notes (Signed)
PROGRESS NOTE    Andrea Wong  ZOX:[id number] DOB: 1934/04/03 DOA: 12/29/2022 PCP: Clovis Riley, L.August Saucer, MD    Brief Narrative:  89 year old with history of type 2 diabetes, GERD, hypertension hyperlipidemia and osteopenia came to emergency room after falling, landing on her left hip and unable to bear weight.  Hemodynamically stable in the ER.  She was found to have left femoral neck fracture and admitted for surgical correction.   Assessment & Plan:   Closed traumatic left hip fracture: Scheduled for total hip 5/22 Dr. Roda Shutters Adequate pain medications SCDs for DVT prophylaxis preop Nonweightbearing until surgery Likely skilled nursing facility placement after the procedure.  Chronic medical issues including Essential hypertension, blood pressure is stable on amlodipine, hydrochlorothiazide and losartan. GERD, on PPI and Pepcid. Hyperlipidemia, on a statin. Type 2 diabetes, well-controlled.  On metformin at home.  Remains on insulin..    DVT prophylaxis: SCDs Start: 12/29/22 1335   Code Status: Full code Family Communication: Husband and daughter at the bedside Disposition Plan: Status is: Inpatient Remains inpatient appropriate because: Inpatient procedure planned     Consultants:  Orthopedics  Procedures:  None  Antimicrobials:  None   Subjective: Seen in the morning rounds.  Family at the bedside.  Denies any complaints at this time.  Looking forward for surgery tomorrow.  Objective: Vitals:   12/29/22 1830 12/29/22 2218 12/30/22 0422 12/30/22 0800  BP: 124/62 116/66 117/60 (!) 112/57  Pulse: 74 65 66 62  Resp: 16   18  Temp: 98.8 F (37.1 C) 97.8 F (36.6 C) 98 F (36.7 C) 97.8 F (36.6 C)  TempSrc: Oral Oral Oral Oral  SpO2: 97% 96% 97% 100%  Weight:      Height:       No intake or output data in the 24 hours ending 12/30/22 1643 Filed Weights   12/29/22 0847  Weight: 68 kg    Examination:  General: Looks fairly  comfortable. Cardiovascular: S1-S2 normal.  Regular rate rhythm. Respiratory: Bilateral clear.  No added sounds. Gastrointestinal: Soft.  Nontender.  Bowel sound present. Ext: Left leg contraction.  Deformed hip.  Distal neurovascular status intact.      Data Reviewed: I have personally reviewed following labs and imaging studies  CBC: Recent Labs  Lab 12/29/22 0917  WBC 8.6  NEUTROABS 7.5  HGB 14.1  HCT 42.0  MCV 87.7  PLT 237   Basic Metabolic Panel: Recent Labs  Lab 12/29/22 0917  NA 134*  K 3.4*  CL 98  CO2 26  GLUCOSE 156*  BUN 9  CREATININE 0.72  CALCIUM 9.4  MG 1.9  PHOS 3.3   GFR: Estimated Creatinine Clearance: 47.8 mL/min (by C-G formula based on SCr of 0.72 mg/dL). Liver Function Tests: No results for input(s): "AST", "ALT", "ALKPHOS", "BILITOT", "PROT", "ALBUMIN" in the last 168 hours. No results for input(s): "LIPASE", "AMYLASE" in the last 168 hours. No results for input(s): "AMMONIA" in the last 168 hours. Coagulation Profile: Recent Labs  Lab 12/29/22 0917  INR 1.0   Cardiac Enzymes: No results for input(s): "CKTOTAL", "CKMB", "CKMBINDEX", "TROPONINI" in the last 168 hours. BNP (last 3 results) No results for input(s): "PROBNP" in the last 8760 hours. HbA1C: Recent Labs    12/30/22 0300  HGBA1C 6.1*   CBG: Recent Labs  Lab 12/29/22 0846 12/29/22 2223 12/30/22 0802 12/30/22 1241  GLUCAP 159* 224* 155* 133*   Lipid Profile: No results for input(s): "CHOL", "HDL", "LDLCALC", "TRIG", "CHOLHDL", "LDLDIRECT" in the last 72 hours. Thyroid  Function Tests: No results for input(s): "TSH", "T4TOTAL", "FREET4", "T3FREE", "THYROIDAB" in the last 72 hours. Anemia Panel: No results for input(s): "VITAMINB12", "FOLATE", "FERRITIN", "TIBC", "IRON", "RETICCTPCT" in the last 72 hours. Sepsis Labs: No results for input(s): "PROCALCITON", "LATICACIDVEN" in the last 168 hours.  No results found for this or any previous visit (from the past 240  hour(s)).       Radiology Studies: CT Hip Left Wo Contrast  Result Date: 12/29/2022 CLINICAL DATA:  Suspected hip fracture EXAM: CT PELVIS WITHOUT CONTRAST CT hip without contrast TECHNIQUE: Multidetector CT imaging of the pelvis and left hip was performed following the standard protocol without intravenous contrast. RADIATION DOSE REDUCTION: This exam was performed according to the departmental dose-optimization program which includes automated exposure control, adjustment of the mA and/or kV according to patient size and/or use of iterative reconstruction technique. COMPARISON:  X-ray 12/29/2022 FINDINGS: Urinary Tract:  Preserved contours of the urinary bladder. Bowel: Sigmoid colon diverticula. Scattered stool. The visualized small and large bowel are nondilated. Vascular/Lymphatic: Minimal vascular calcifications along the left and right common iliac arteries. No specific abnormal lymph node enlargement identified. Reproductive:  Uterus is absent.  No adnexal mass. Other: Motion seen throughout the examination. Anasarca. No free fluid in the pelvis. Musculoskeletal: Minimally angulated and displaced subcapital femoral neck fracture on the left. Small joint effusion. Underlying osteopenia. Degenerative changes also seen of the pelvis including both sacroiliac joints with sclerosis and joint space loss and small osteophytes. Hypertrophic changes along the pubic symphysis. There is also some concentric joint space loss seen of the hip joints. Degenerative changes seen of the lumbar spine at the edge of the imaging field as well. There is a spiculated sclerotic focus along the intertrochanteric region of the right hip consistent with a bone island. IMPRESSION: Minimally angulated and displaced subcapital left femoral neck fracture. Osteopenia with multifocal degenerative changes. Sigmoid colon diverticula. Motion artifact. Electronically Signed   By: Karen Kays M.D.   On: 12/29/2022 11:12   CT PELVIS  WO CONTRAST  Result Date: 12/29/2022 CLINICAL DATA:  Suspected hip fracture EXAM: CT PELVIS WITHOUT CONTRAST CT hip without contrast TECHNIQUE: Multidetector CT imaging of the pelvis and left hip was performed following the standard protocol without intravenous contrast. RADIATION DOSE REDUCTION: This exam was performed according to the departmental dose-optimization program which includes automated exposure control, adjustment of the mA and/or kV according to patient size and/or use of iterative reconstruction technique. COMPARISON:  X-ray 12/29/2022 FINDINGS: Urinary Tract:  Preserved contours of the urinary bladder. Bowel: Sigmoid colon diverticula. Scattered stool. The visualized small and large bowel are nondilated. Vascular/Lymphatic: Minimal vascular calcifications along the left and right common iliac arteries. No specific abnormal lymph node enlargement identified. Reproductive:  Uterus is absent.  No adnexal mass. Other: Motion seen throughout the examination. Anasarca. No free fluid in the pelvis. Musculoskeletal: Minimally angulated and displaced subcapital femoral neck fracture on the left. Small joint effusion. Underlying osteopenia. Degenerative changes also seen of the pelvis including both sacroiliac joints with sclerosis and joint space loss and small osteophytes. Hypertrophic changes along the pubic symphysis. There is also some concentric joint space loss seen of the hip joints. Degenerative changes seen of the lumbar spine at the edge of the imaging field as well. There is a spiculated sclerotic focus along the intertrochanteric region of the right hip consistent with a bone island. IMPRESSION: Minimally angulated and displaced subcapital left femoral neck fracture. Osteopenia with multifocal degenerative changes. Sigmoid colon diverticula. Motion artifact.  Electronically Signed   By: Karen Kays M.D.   On: 12/29/2022 11:12   DG Lumbar Spine Complete  Result Date: 12/29/2022 CLINICAL  DATA:  Trauma, fall, pain EXAM: LUMBAR SPINE - COMPLETE 4+ VIEW COMPARISON:  None Available. FINDINGS: No recent fracture is seen. Degenerative changes are noted with disc space narrowing and bony spurs at multiple levels. There is minimal anterolisthesis at L4-L5 level. Dextroscoliosis is seen in lumbar spine. Surgical clips are noted in anterior upper and mid abdomen. Few surgical clips are seen in pelvis. IMPRESSION: No recent fracture is seen in the lumbar spine. Lumbar spondylosis, more so from L3-S1 levels. There is possible minimal anterolisthesis at the L4-L5 level. Dextroscoliosis. Electronically Signed   By: Ernie Avena M.D.   On: 12/29/2022 10:02   DG Hip Unilat With Pelvis 2-3 Views Left  Result Date: 12/29/2022 CLINICAL DATA:  Pain after fall EXAM: DG HIP (WITH OR WITHOUT PELVIS) 3V LEFT COMPARISON:  None Available. FINDINGS: There is subtle linear lucency along the femoral neck on the left. Nondisplaced fracture is possible. Recommend confirmatory cross-sectional imaging study. Preserved joint spaces. No additional fracture or dislocation. Osteopenia. Spiculated focus along the intertrochanteric region of the right hip may be a bone island. IMPRESSION: There is subtle linear lucency along the femoral neck on the left with disturbed trabecula. Nondisplaced fracture is possible. Recommend confirmatory cross-sectional imaging study. Electronically Signed   By: Karen Kays M.D.   On: 12/29/2022 09:59        Scheduled Meds:  amLODipine  5 mg Oral Daily   aspirin EC  81 mg Oral Daily   calcium-vitamin D  1 tablet Oral Daily   famotidine  20 mg Oral Daily   feeding supplement  237 mL Oral BID BM   hydrochlorothiazide  12.5 mg Oral q morning   insulin aspart  0-9 Units Subcutaneous TID WC   losartan  50 mg Oral Daily   metFORMIN  500 mg Oral Q breakfast   multivitamin with minerals  1 tablet Oral Daily   pantoprazole  40 mg Oral Daily   pravastatin  10 mg Oral Daily    senna-docusate  1 tablet Oral BID   [START ON 12/31/2022] tranexamic acid (CYKLOKAPRON) 2,000 mg in sodium chloride 0.9 % 50 mL Topical Application  2,000 mg Topical To OR   Continuous Infusions:   LOS: 1 day    Time spent: 35 minutes    Dorcas Carrow, MD [facility] Pager [phone]

## 2022-12-30 NOTE — TOC Initial Note (Signed)
Transition of Care (TOC) - Initial/Assessment Note   Spoke to patient , husband and daughter at bedside.   Patient from home. Confirmed face sheet information.   PCP Lupe Carney   Patient has cane , walker and shower chair at home already.   Patient has never had home health services.   TOC team will continue to follow for discharge needs  Patient Details  Name: Andrea Wong MRN: [id number] Date of Birth: 01-16-1934  Transition of Care [facility]) CM/SW Contact:    Kingsley Plan, RN Phone Number: 12/30/2022, 11:27 AM  Clinical Narrative:                   Expected Discharge Plan:  (Await post op PT recommendations) Barriers to Discharge: Continued Medical Work up   Patient Goals and CMS Choice Patient states their goals for this hospitalization and ongoing recovery are:: to return home     [place] ownership interest in [facility].provided to:: Patient    Expected Discharge Plan and Services   Discharge Planning Services: CM Consult   Living arrangements for the past 2 months: Single Family Home                 DME Arranged: N/A (await post op PT recommendations)         HH Arranged:  (await Post op PT recommendations)          Prior Living Arrangements/Services Living arrangements for the past 2 months: Single Family Home Lives with:: Spouse Patient language and need for interpreter reviewed:: Yes Do you feel safe going back to the place where you live?: Yes      Need for Family Participation in Patient Care: Yes (Comment) Care giver support system in place?: Yes (comment) Current home services: DME Criminal Activity/Legal Involvement Pertinent to Current Situation/Hospitalization: No - Comment as needed  Activities of Daily Living      Permission Sought/Granted   Permission granted to share information with : Yes, Verbal Permission Granted  Share Information with NAME: husband and daughter           Emotional  Assessment Appearance:: Appears stated age Attitude/Demeanor/Rapport: Engaged Affect (typically observed): Accepting Orientation: : Oriented to Self, Oriented to Place, Oriented to  Time, Oriented to Situation Alcohol / Substance Use: Not Applicable Psych Involvement: No (comment)  Admission diagnosis:  Closed fracture of left hip, initial encounter (HCC) [S72.002A] Closed left hip fracture, initial encounter [facility]) [S72.002A] Patient Active Problem List   Diagnosis Date Noted   Closed left hip fracture, initial encounter (HCC) 12/29/2022   Essential hypertension 12/29/2022   Gastroesophageal reflux disease 12/29/2022   Hypercholesterolemia 12/29/2022   Osteopenia 12/29/2022   Type 2 diabetes mellitus (HCC) 12/29/2022   Lumbar radiculopathy 08/21/2020   Primary osteoarthritis of left knee 01/03/2020   Primary osteoarthritis of right knee 01/03/2020   PCP:  Clovis Riley, L.August Saucer, MD Pharmacy:   [facility] [address] [place] - [address] [place] [address] Phone: [phone] Fax: [phone]     Social Determinants of Health (SDOH) Social History: SDOH Screenings   Tobacco Use: Low Risk  (12/29/2022)   SDOH Interventions:     Readmission Risk Interventions     No data to display

## 2022-12-30 NOTE — Progress Notes (Signed)
Initial Nutrition Assessment  DOCUMENTATION CODES:   Not applicable  INTERVENTION:  Multivitamin w/ minerals daily Ensure Enlive po BID, each supplement provides 350 kcal and 20 grams of protein. Liberalize diet to regular due to increased needs Encourage good PO intake   NUTRITION DIAGNOSIS:   Increased nutrient needs related to hip fracture as evidenced by estimated needs.  GOAL:   Patient will meet greater than or equal to 90% of their needs  MONITOR:   PO intake, Supplement acceptance, Labs, I & O's  REASON FOR ASSESSMENT:   Consult Hip fracture protocol  ASSESSMENT:   89 y.o. female presented to the ED after a mechanical fall with immediate pain. PMH includes T2DM, GERD, HTN, HLD, and cancer. Pt admitted with closed L hip fracture.   Met with pt and family in room. Pt reports that her appetite has been good at home. Does not use any oral nutrition supplements. Reports a UBW of 150#, with no weight changes. Pt gave RD lunch order; RD placed order. Informed family that RD will adjust to meal ordering with assist.  Discussed using Ensure post-op due to increased needs for healing. Pt agreeable to Ensure.   Medications reviewed and include: Calcium-Vitamin D, Pepcid, NovoLog SSI, Metformin, Protonix, Senokot-S Labs reviewed: Sodium 134, Potassium 3.4, Phosphorus 3.3, Magnesium 1.9, Hgb A1c 6.1 CBGs: 155-224 x 24 hrs  NUTRITION - FOCUSED PHYSICAL EXAM:  Deferred to follow-up.   Diet Order:   Diet Order             Diet regular Room service appropriate? Yes with Assist; Fluid consistency: Thin  Diet effective now                   EDUCATION NEEDS:   Education needs have been addressed  Skin:  Skin Assessment: Reviewed RN Assessment  Last BM:  Unknown  Height:  Ht Readings from Last 1 Encounters:  12/29/22 5\' 4"  (1.626 m)   Weight:  Wt Readings from Last 1 Encounters:  12/29/22 68 kg   Ideal Body Weight:  54.6 kg  BMI:  Body mass index is  25.75 kg/m.  Estimated Nutritional Needs:  Kcal:  1800-2000 Protein:  90-110 grams Fluid:  >/= 1.8   Kirby Crigler RD, LDN Clinical Dietitian See [facility] for contact information.

## 2022-12-31 ENCOUNTER — Other Ambulatory Visit: Payer: Self-pay

## 2022-12-31 ENCOUNTER — Inpatient Hospital Stay (HOSPITAL_COMMUNITY): Payer: Medicare PPO | Admitting: Anesthesiology

## 2022-12-31 ENCOUNTER — Encounter (HOSPITAL_COMMUNITY): Payer: Self-pay

## 2022-12-31 ENCOUNTER — Inpatient Hospital Stay (HOSPITAL_COMMUNITY): Payer: Medicare PPO

## 2022-12-31 ENCOUNTER — Encounter (HOSPITAL_COMMUNITY)
Admission: EM | Disposition: A | Discharge status: Skilled Nursing Facility | Payer: Self-pay | Source: Home / Self Care | Attending: Internal Medicine

## 2022-12-31 DIAGNOSIS — S72012A Unspecified intracapsular fracture of left femur, initial encounter for closed fracture: Secondary | ICD-10-CM

## 2022-12-31 DIAGNOSIS — M1612 Unilateral primary osteoarthritis, left hip: Secondary | ICD-10-CM

## 2022-12-31 DIAGNOSIS — S72042A Displaced fracture of base of neck of left femur, initial encounter for closed fracture: Secondary | ICD-10-CM

## 2022-12-31 DIAGNOSIS — I10 Essential (primary) hypertension: Secondary | ICD-10-CM

## 2022-12-31 DIAGNOSIS — S72002A Fracture of unspecified part of neck of left femur, initial encounter for closed fracture: Secondary | ICD-10-CM | POA: Diagnosis not present

## 2022-12-31 DIAGNOSIS — E119 Type 2 diabetes mellitus without complications: Secondary | ICD-10-CM

## 2022-12-31 HISTORY — PX: TOTAL HIP ARTHROPLASTY: SHX124

## 2022-12-31 LAB — SURGICAL PCR SCREEN
MRSA, PCR: NEGATIVE
Staphylococcus aureus: NEGATIVE

## 2022-12-31 LAB — CBC
HCT: 40 % (ref 36.0–46.0)
Hemoglobin: 13.2 g/dL (ref 12.0–15.0)
MCH: 29.2 pg (ref 26.0–34.0)
MCHC: 33 g/dL (ref 30.0–36.0)
MCV: 88.5 fL (ref 80.0–100.0)
Platelets: 223 10*3/uL (ref 150–400)
RBC: 4.52 MIL/uL (ref 3.87–5.11)
RDW: 13.7 % (ref 11.5–15.5)
WBC: 12.3 10*3/uL — ABNORMAL HIGH (ref 4.0–10.5)
nRBC: 0 % (ref 0.0–0.2)

## 2022-12-31 LAB — GLUCOSE, CAPILLARY
Glucose-Capillary: 94 mg/dL (ref 70–99)
Glucose-Capillary: 98 mg/dL (ref 70–99)
Glucose-Capillary: 120 mg/dL — ABNORMAL HIGH (ref 70–99)
Glucose-Capillary: 107 mg/dL — ABNORMAL HIGH (ref 70–99)
Glucose-Capillary: 118 mg/dL — ABNORMAL HIGH (ref 70–99)
Glucose-Capillary: 202 mg/dL — ABNORMAL HIGH (ref 70–99)

## 2022-12-31 LAB — CREATININE, SERUM
Creatinine, Ser: 0.78 mg/dL (ref 0.44–1.00)
GFR, Estimated: >60 mL/min (ref >60)

## 2022-12-31 SURGERY — ARTHROPLASTY, HIP, TOTAL, ANTERIOR APPROACH
Anesthesia: Monitor Anesthesia Care | Site: Hip | Laterality: Left

## 2022-12-31 MED ORDER — EPHEDRINE SULFATE-NACL 50-0.9 MG/10ML-% IV SOSY
PREFILLED_SYRINGE | INTRAVENOUS | Status: DC | PRN
  Administered 2022-12-31 (×2): 5 mg via INTRAVENOUS
  Administered 2022-12-31: 10 mg via INTRAVENOUS

## 2022-12-31 MED ORDER — ACETAMINOPHEN 500 MG PO TABS
1000.0000 mg | ORAL_TABLET | Freq: Four times a day (QID) | ORAL | Status: AC
  Administered 2022-12-31 – 2023-01-01 (×4): 1000 mg via ORAL
  Filled 2022-12-31 (×3): qty 2

## 2022-12-31 MED ORDER — BUPIVACAINE IN DEXTROSE 0.75-8.25 % IT SOLN
INTRATHECAL | Status: DC | PRN
  Administered 2022-12-31: 1.7 mL via INTRATHECAL

## 2022-12-31 MED ORDER — SODIUM CHLORIDE 0.9 % IR SOLN
Status: DC | PRN
  Administered 2022-12-31: 1000 mL

## 2022-12-31 MED ORDER — CHLORHEXIDINE GLUCONATE 0.12 % MT SOLN
15.0000 mL | Freq: Once | OROMUCOSAL | Status: AC
  Administered 2022-12-31: 15 mL via OROMUCOSAL
  Filled 2022-12-31: qty 15

## 2022-12-31 MED ORDER — 0.9 % SODIUM CHLORIDE (POUR BTL) OPTIME
TOPICAL | Status: DC | PRN
  Administered 2022-12-31: 1000 mL

## 2022-12-31 MED ORDER — TRANEXAMIC ACID 1000 MG/10ML IV SOLN
INTRAVENOUS | Status: DC | PRN
  Administered 2022-12-31: 2000 mg via TOPICAL

## 2022-12-31 MED ORDER — HYDROMORPHONE HCL 1 MG/ML IJ SOLN
0.5000 mg | INTRAMUSCULAR | Status: DC | PRN
  Administered 2023-01-01: 0.5 mg via INTRAVENOUS

## 2022-12-31 MED ORDER — PHENOL 1.4 % MT LIQD: 1.0000 | OROMUCOSAL | Status: DC | PRN

## 2022-12-31 MED ORDER — POVIDONE-IODINE 10 % EX SWAB
2.0000 | Freq: Once | CUTANEOUS | Status: AC
  Administered 2022-12-31: 2 via TOPICAL

## 2022-12-31 MED ORDER — BUPIVACAINE-MELOXICAM ER 400-12 MG/14ML IJ SOLN
INTRAMUSCULAR | Status: AC
  Filled 2022-12-31: qty 1

## 2022-12-31 MED ORDER — LACTATED RINGERS IV SOLN: INTRAVENOUS | Status: DC

## 2022-12-31 MED ORDER — ENOXAPARIN SODIUM 40 MG/0.4ML IJ SOSY: 40.0000 mg | PREFILLED_SYRINGE | Freq: Every day | INTRAMUSCULAR | 0 refills | Status: AC

## 2022-12-31 MED ORDER — SODIUM CHLORIDE 0.9 % IV SOLN: INTRAVENOUS | Status: DC

## 2022-12-31 MED ORDER — MAGNESIUM CITRATE PO SOLN: 1.0000 | Freq: Once | ORAL | Status: DC | PRN

## 2022-12-31 MED ORDER — PRONTOSAN WOUND IRRIGATION OPTIME
TOPICAL | Status: DC | PRN
  Administered 2022-12-31: 1 via TOPICAL

## 2022-12-31 MED ORDER — POLYETHYLENE GLYCOL 3350 17 G PO PACK: 17.0000 g | PACK | Freq: Every day | ORAL | Status: DC | PRN

## 2022-12-31 MED ORDER — SORBITOL 70 % SOLN
30.0000 mL | Freq: Every day | Status: DC | PRN
  Administered 2023-01-02: 30 mL via ORAL
  Filled 2022-12-31 (×2): qty 30

## 2022-12-31 MED ORDER — OXYCODONE HCL 5 MG PO TABS: 10.0000 mg | ORAL_TABLET | ORAL | Status: DC | PRN

## 2022-12-31 MED ORDER — PROPOFOL 1000 MG/100ML IV EMUL
INTRAVENOUS | Status: AC
  Filled 2022-12-31: qty 100

## 2022-12-31 MED ORDER — METHOCARBAMOL 500 MG PO TABS: 500.0000 mg | ORAL_TABLET | Freq: Four times a day (QID) | ORAL | Status: DC | PRN

## 2022-12-31 MED ORDER — FENTANYL CITRATE (PF) 250 MCG/5ML IJ SOLN
INTRAMUSCULAR | Status: DC | PRN
  Administered 2022-12-31: 50 ug via INTRAVENOUS

## 2022-12-31 MED ORDER — CHLORHEXIDINE GLUCONATE 0.12 % MT SOLN: 15.0000 mL | OROMUCOSAL | Status: AC

## 2022-12-31 MED ORDER — OXYCODONE-ACETAMINOPHEN 5-325 MG PO TABS: 1.0000 | ORAL_TABLET | Freq: Two times a day (BID) | ORAL | 0 refills | Status: AC | PRN

## 2022-12-31 MED ORDER — VANCOMYCIN HCL 1000 MG IV SOLR
INTRAVENOUS | Status: AC
  Filled 2022-12-31: qty 20

## 2022-12-31 MED ORDER — PROPOFOL 10 MG/ML IV BOLUS
INTRAVENOUS | Status: AC
  Filled 2022-12-31: qty 20

## 2022-12-31 MED ORDER — TRANEXAMIC ACID-NACL 1000-0.7 MG/100ML-% IV SOLN
1000.0000 mg | INTRAVENOUS | Status: AC
  Administered 2022-12-31: 1000 mg via INTRAVENOUS
  Filled 2022-12-31: qty 100

## 2022-12-31 MED ORDER — CEFAZOLIN SODIUM-DEXTROSE 2-4 GM/100ML-% IV SOLN
2.0000 g | INTRAVENOUS | Status: AC
  Administered 2022-12-31: 2 g via INTRAVENOUS
  Filled 2022-12-31 (×2): qty 100

## 2022-12-31 MED ORDER — FENTANYL CITRATE (PF) 250 MCG/5ML IJ SOLN
INTRAMUSCULAR | Status: AC
  Filled 2022-12-31: qty 5

## 2022-12-31 MED ORDER — ACETAMINOPHEN 325 MG PO TABS: 325.0000 mg | ORAL_TABLET | Freq: Four times a day (QID) | ORAL | Status: DC | PRN

## 2022-12-31 MED ORDER — ONDANSETRON HCL 4 MG/2ML IJ SOLN: 4.0000 mg | Freq: Four times a day (QID) | INTRAMUSCULAR | Status: DC | PRN

## 2022-12-31 MED ORDER — PROPOFOL 500 MG/50ML IV EMUL
INTRAVENOUS | Status: DC | PRN
  Administered 2022-12-31: 50 ug/kg/min via INTRAVENOUS

## 2022-12-31 MED ORDER — ORAL CARE MOUTH RINSE: 15.0000 mL | Freq: Once | OROMUCOSAL | Status: AC

## 2022-12-31 MED ORDER — EPHEDRINE 5 MG/ML INJ
INTRAVENOUS | Status: AC
  Filled 2022-12-31: qty 5

## 2022-12-31 MED ORDER — PHENYLEPHRINE 80 MCG/ML (10ML) SYRINGE FOR IV PUSH (FOR BLOOD PRESSURE SUPPORT)
PREFILLED_SYRINGE | INTRAVENOUS | Status: AC
  Filled 2022-12-31: qty 10

## 2022-12-31 MED ORDER — ASPIRIN 81 MG PO CHEW
81.0000 mg | CHEWABLE_TABLET | Freq: Once | ORAL | Status: AC
  Administered 2022-12-31: 81 mg via ORAL
  Filled 2022-12-31: qty 1

## 2022-12-31 MED ORDER — METHOCARBAMOL 1000 MG/10ML IJ SOLN: 500.0000 mg | Freq: Four times a day (QID) | INTRAVENOUS | Status: DC | PRN

## 2022-12-31 MED ORDER — OXYCODONE HCL 5 MG PO TABS
5.0000 mg | ORAL_TABLET | ORAL | Status: DC | PRN
  Administered 2023-01-01: 5 mg via ORAL
  Filled 2022-12-31: qty 1

## 2022-12-31 MED ORDER — VANCOMYCIN HCL 1 G IV SOLR
INTRAVENOUS | Status: DC | PRN
  Administered 2022-12-31: 1000 mg via TOPICAL

## 2022-12-31 MED ORDER — DOCUSATE SODIUM 100 MG PO CAPS
100.0000 mg | ORAL_CAPSULE | Freq: Two times a day (BID) | ORAL | Status: DC
  Administered 2022-12-31 – 2023-01-02 (×4): 100 mg via ORAL
  Filled 2022-12-31 (×4): qty 1

## 2022-12-31 MED ORDER — PRAVASTATIN SODIUM 10 MG PO TABS
10.0000 mg | ORAL_TABLET | Freq: Every day | ORAL | Status: DC
  Administered 2023-01-01: 10 mg via ORAL
  Filled 2022-12-31: qty 1

## 2022-12-31 MED ORDER — ENOXAPARIN SODIUM 40 MG/0.4ML IJ SOSY
40.0000 mg | PREFILLED_SYRINGE | INTRAMUSCULAR | Status: DC
  Administered 2023-01-01: 40 mg via SUBCUTANEOUS
  Filled 2022-12-31: qty 0.4

## 2022-12-31 MED ORDER — ALUM & MAG HYDROXIDE-SIMETH 200-200-20 MG/5ML PO SUSP: 30.0000 mL | ORAL | Status: DC | PRN

## 2022-12-31 MED ORDER — OYSTER SHELL CALCIUM/D3 500-5 MG-MCG PO TABS
1.0000 | ORAL_TABLET | Freq: Every day | ORAL | Status: DC
  Administered 2023-01-01: 1 via ORAL
  Filled 2022-12-31: qty 1

## 2022-12-31 MED ORDER — ONDANSETRON HCL 4 MG PO TABS: 4.0000 mg | ORAL_TABLET | Freq: Four times a day (QID) | ORAL | Status: DC | PRN

## 2022-12-31 MED ORDER — PHENYLEPHRINE HCL-NACL 20-0.9 MG/250ML-% IV SOLN
INTRAVENOUS | Status: DC | PRN
  Administered 2022-12-31: 50 ug/min via INTRAVENOUS

## 2022-12-31 MED ORDER — CHLORHEXIDINE GLUCONATE 0.12 % MT SOLN
OROMUCOSAL | Status: AC
  Filled 2022-12-31: qty 15

## 2022-12-31 MED ORDER — CEFAZOLIN SODIUM-DEXTROSE 2-4 GM/100ML-% IV SOLN
2.0000 g | Freq: Four times a day (QID) | INTRAVENOUS | Status: AC
  Administered 2022-12-31 – 2023-01-01 (×3): 2 g via INTRAVENOUS
  Filled 2022-12-31 (×3): qty 100

## 2022-12-31 MED ORDER — MENTHOL 3 MG MT LOZG: 1.0000 | LOZENGE | OROMUCOSAL | Status: DC | PRN

## 2022-12-31 MED ORDER — BUPIVACAINE-MELOXICAM ER 400-12 MG/14ML IJ SOLN
INTRAMUSCULAR | Status: DC | PRN
  Administered 2022-12-31: 400 mg

## 2022-12-31 SURGICAL SUPPLY — 67 items
ACETAB CUP W/GRIPTION 54 (Plate) ×1 IMPLANT
ADH SKN CLS APL DERMABOND .7 (GAUZE/BANDAGES/DRESSINGS) ×1
ARTICULEZE HEAD (Hips) ×1 IMPLANT
BAG COUNTER SPONGE SURGICOUNT (BAG) ×1 IMPLANT
BAG DECANTER FOR FLEXI CONT (MISCELLANEOUS) ×1 IMPLANT
BAG SPNG CNTER NS LX DISP (BAG) ×1
BLADE SAG 18X100X1.27 (BLADE) ×1 IMPLANT
COVER PERINEAL POST (MISCELLANEOUS) ×1 IMPLANT
COVER SURGICAL LIGHT HANDLE (MISCELLANEOUS) ×1 IMPLANT
CUP ACETAB W/GRIPTION 54 (Plate) IMPLANT
DERMABOND ADVANCED .7 DNX12 (GAUZE/BANDAGES/DRESSINGS) IMPLANT
DRAPE C-ARM 42X72 X-RAY (DRAPES) ×1 IMPLANT
DRAPE POUCH INSTRU U-SHP 10X18 (DRAPES) ×1 IMPLANT
DRAPE STERI IOBAN 125X83 (DRAPES) ×1 IMPLANT
DRAPE U-SHAPE 47X51 STRL (DRAPES) ×2 IMPLANT
DRSG AQUACEL AG ADV 3.5X10 (GAUZE/BANDAGES/DRESSINGS) ×1 IMPLANT
DURAPREP 26ML APPLICATOR (WOUND CARE) ×2 IMPLANT
ELECT BLADE 4.0 EZ CLEAN MEGAD (MISCELLANEOUS) ×1
ELECT REM PT RETURN 9FT ADLT (ELECTROSURGICAL) ×1
ELECTRODE BLDE 4.0 EZ CLN MEGD (MISCELLANEOUS) ×1 IMPLANT
ELECTRODE REM PT RTRN 9FT ADLT (ELECTROSURGICAL) ×1 IMPLANT
GLOVE BIOGEL PI IND STRL 7.0 (GLOVE) ×2 IMPLANT
GLOVE BIOGEL PI IND STRL 7.5 (GLOVE) ×5 IMPLANT
GLOVE ECLIPSE 7.0 STRL STRAW (GLOVE) ×2 IMPLANT
GLOVE SKINSENSE STRL SZ7.5 (GLOVE) ×1 IMPLANT
GLOVE SURG SYN 7.5  E (GLOVE) ×2
GLOVE SURG SYN 7.5 E (GLOVE) ×2 IMPLANT
GLOVE SURG SYN 7.5 PF PI (GLOVE) ×2 IMPLANT
GLOVE SURG UNDER POLY LF SZ7 (GLOVE) ×3 IMPLANT
GLOVE SURG UNDER POLY LF SZ7.5 (GLOVE) ×2 IMPLANT
GOWN STRL REUS W/ TWL LRG LVL3 (GOWN DISPOSABLE) IMPLANT
GOWN STRL REUS W/ TWL XL LVL3 (GOWN DISPOSABLE) ×1 IMPLANT
GOWN STRL REUS W/TWL LRG LVL3 (GOWN DISPOSABLE)
GOWN STRL REUS W/TWL XL LVL3 (GOWN DISPOSABLE) ×1
GOWN STRL SURGICAL XL XLNG (GOWN DISPOSABLE) ×1 IMPLANT
GOWN TOGA ZIPPER T7+ PEEL AWAY (MISCELLANEOUS) ×2 IMPLANT
HANDPIECE INTERPULSE COAX TIP (DISPOSABLE) ×1
HEAD ARTICULEZE (Hips) IMPLANT
HOOD PEEL AWAY T7 (MISCELLANEOUS) ×1 IMPLANT
IV NS IRRIG 3000ML ARTHROMATIC (IV SOLUTION) ×1 IMPLANT
KIT BASIN OR (CUSTOM PROCEDURE TRAY) ×1 IMPLANT
LINER NEUTRAL 54X36MM PLUS 4 (Hips) IMPLANT
MARKER SKIN DUAL TIP RULER LAB (MISCELLANEOUS) ×1 IMPLANT
NDL SPNL 18GX3.5 QUINCKE PK (NEEDLE) ×1 IMPLANT
NEEDLE SPNL 18GX3.5 QUINCKE PK (NEEDLE) ×1 IMPLANT
PACK TOTAL JOINT (CUSTOM PROCEDURE TRAY) ×1 IMPLANT
PACK UNIVERSAL I (CUSTOM PROCEDURE TRAY) ×1 IMPLANT
SCREW 6.5MMX30MM (Screw) IMPLANT
SET HNDPC FAN SPRY TIP SCT (DISPOSABLE) ×1 IMPLANT
SOLUTION PRONTOSAN WOUND 350ML (IRRIGATION / IRRIGATOR) ×1 IMPLANT
STAPLER VISISTAT 35W (STAPLE) IMPLANT
STEM FEM ACTIS STD SZ4 (Stem) IMPLANT
SUT ETHIBOND 2 V 37 (SUTURE) ×1 IMPLANT
SUT ETHILON 2 0 FS 18 (SUTURE) IMPLANT
SUT VIC AB 0 CT1 27 (SUTURE) ×1
SUT VIC AB 0 CT1 27XBRD ANBCTR (SUTURE) ×1 IMPLANT
SUT VIC AB 1 CTX 36 (SUTURE) ×1
SUT VIC AB 1 CTX36XBRD ANBCTR (SUTURE) ×1 IMPLANT
SUT VIC AB 2-0 CT1 27 (SUTURE) ×3
SUT VIC AB 2-0 CT1 TAPERPNT 27 (SUTURE) ×2 IMPLANT
SYR 50ML LL SCALE MARK (SYRINGE) ×1 IMPLANT
TOWEL GREEN STERILE (TOWEL DISPOSABLE) ×1 IMPLANT
TRAY CATH INTERMITTENT SS 16FR (CATHETERS) IMPLANT
TRAY FOLEY W/BAG SLVR 16FR (SET/KITS/TRAYS/PACK)
TRAY FOLEY W/BAG SLVR 16FR ST (SET/KITS/TRAYS/PACK) IMPLANT
TUBE SUCT ARGYLE STRL (TUBING) ×1 IMPLANT
YANKAUER SUCT BULB TIP NO VENT (SUCTIONS) ×1 IMPLANT

## 2022-12-31 NOTE — Progress Notes (Signed)
4 wedding rings handed off to nurse tech to take back to family in the room.

## 2022-12-31 NOTE — Discharge Instructions (Signed)

## 2022-12-31 NOTE — Plan of Care (Signed)
  Problem: Education: Goal: Knowledge of General Education information will improve Description: Including pain rating scale, medication(s)/side effects and non-pharmacologic comfort measures Outcome: Progressing   Problem: Clinical Measurements: Goal: Will remain free from infection Outcome: Progressing Goal: Respiratory complications will improve Outcome: Progressing Goal: Cardiovascular complication will be avoided Outcome: Progressing   Problem: Activity: Goal: Risk for activity intolerance will decrease Outcome: Progressing   

## 2022-12-31 NOTE — Transfer of Care (Signed)
Immediate Anesthesia Transfer of Care Note  Patient: Steele Sizer  Procedure(s) Performed: TOTAL HIP ARTHROPLASTY ANTERIOR APPROACH (Left: Hip)  Patient Location: PACU  Anesthesia Type:Spinal  Level of Consciousness: awake and drowsy  Airway & Oxygen Therapy: Patient Spontanous Breathing and Patient connected to nasal cannula oxygen  Post-op Assessment: Report given to RN and Post -op Vital signs reviewed and stable  Post vital signs: Reviewed and stable  Last Vitals:  Vitals Value Taken Time  BP 90/78 12/31/22 1635  Temp 36.4 C 12/31/22 1635  Pulse 82 12/31/22 1636  Resp 16 12/31/22 1636  SpO2 92 % 12/31/22 1636  Vitals shown include unvalidated device data.  Last Pain:  Vitals:   12/31/22 1417  TempSrc:   PainSc: 0-No pain      Patients Stated Pain Goal: 0 (12/31/22 1417)  Complications: No notable events documented.

## 2022-12-31 NOTE — Anesthesia Postprocedure Evaluation (Signed)
Anesthesia Post Note  Patient: Andrea Wong  Procedure(s) Performed: TOTAL HIP ARTHROPLASTY ANTERIOR APPROACH (Left: Hip)     Patient location during evaluation: PACU Anesthesia Type: MAC and Spinal Level of consciousness: awake and alert Pain management: pain level controlled Vital Signs Assessment: post-procedure vital signs reviewed and stable Respiratory status: spontaneous breathing and respiratory function stable Cardiovascular status: blood pressure returned to baseline and stable Postop Assessment: spinal receding Anesthetic complications: no   No notable events documented.  Last Vitals:  Vitals:   12/31/22 1645 12/31/22 1700  BP: 111/63 110/70  Pulse: 72 74  Resp: 14 16  Temp: 36.4 C 36.4 C  SpO2: 95% 95%    Last Pain:  Vitals:   12/31/22 1700  TempSrc: Oral  PainSc:                  Sota Hetz DANIEL

## 2022-12-31 NOTE — H&P (Signed)

## 2022-12-31 NOTE — Anesthesia Procedure Notes (Signed)
Spinal  Patient location during procedure: OR Start time: 12/31/2022 2:34 PM End time: 12/31/2022 2:44 PM Reason for block: surgical anesthesia Staffing Performed: anesthesiologist  Anesthesiologist: Heather Roberts, MD Performed by: Heather Roberts, MD Authorized by: Heather Roberts, MD   Preanesthetic Checklist Completed: patient identified, IV checked, risks and benefits discussed, surgical consent, monitors and equipment checked, pre-op evaluation and timeout performed Spinal Block Patient position: left lateral decubitus Prep: DuraPrep Patient monitoring: cardiac monitor, continuous pulse ox and blood pressure Approach: left paramedian Location: L2-3 Injection technique: single-shot Needle Needle type: Pencan  Needle gauge: 24 G Needle length: 9 cm Assessment Events: CSF return Additional Notes Functioning IV was confirmed and monitors were applied. Sterile prep and drape, including hand hygiene and sterile gloves were used. The patient was positioned and the spine was prepped. The skin was anesthetized with lidocaine.  Free flow of clear CSF was obtained prior to injecting local anesthetic into the CSF.  The spinal needle aspirated freely following injection.  The needle was carefully withdrawn.  The patient tolerated the procedure well.

## 2022-12-31 NOTE — Op Note (Signed)
TOTAL HIP ARTHROPLASTY ANTERIOR APPROACH  Procedure Note RETAL HARLAN   [id number]  Pre-op Diagnosis: Left femoral neck fracture     Post-op Diagnosis: same  Operative Findings Acute subcapital femoral neck fracture Preexisting left hip DJD   Operative Procedures  1. Total hip replacement; Left hip; uncemented [id number]   Surgeon: Gershon Mussel, M.D.  Assist: Oneal Grout, PA-C   Anesthesia: spinal  Prosthesis: Depuy Acetabulum: Pinnacle 54 mm Femur: Actis 4 STD Head: 36 mm size: +5 Liner: +4 Bearing Type: metal/poly  Total Hip Arthroplasty (Anterior Approach) Op Note:  After informed consent was obtained and the operative extremity marked in the holding area, the patient was brought back to the operating room and placed supine on the HANA table. Next, the operative extremity was prepped and draped in normal sterile fashion. Surgical timeout occurred verifying patient identification, surgical site, surgical procedure and administration of antibiotics.  A Hueter approach to the hip was performed, using the interval between tensor fascia lata and sartorius.  Dissection was carried bluntly down onto the anterior hip capsule. The lateral femoral circumflex vessels were identified and coagulated. A capsulotomy was performed and the capsular flaps tagged for later repair.  Fracture hematoma was evacuated.  Fracture line was directly visualized.  The neck osteotomy was performed 1 fingerbreadth above the lesser trochanter. The femoral head was removed which showed moderate DJD, the acetabular rim was cleared of soft tissue and osteophytes and attention was turned to reaming the acetabulum.  Sequential reaming was performed under fluoroscopic guidance down to the floor of the cotyloid fossa. We reamed to a size 53 mm, and then impacted the acetabular shell. A 30 mm cancellous screw was placed through the shell for added fixation.  The liner was then placed after irrigation and  attention turned to the femur.  After placing the femoral hook, the leg was taken to externally rotated, extended and adducted position taking care to perform soft tissue releases to allow for adequate mobilization of the femur. Soft tissue was cleared from the shoulder of the greater trochanter and the hook elevator used to improve exposure of the proximal femur. Sequential broaching performed up to a size 4. Trial neck and head were placed. The leg was brought back up to neutral and the construct reduced.  Antibiotic irrigation was placed in the surgical wound.  The position and sizing of components, offset and leg lengths were checked using fluoroscopy. Stability of the construct was checked in extension and external rotation without any subluxation, shuck or impingement of prosthesis. We dislocated the prosthesis, dropped the leg back into position, removed trial components, and irrigated copiously. The final stem and head was then placed, the leg brought back up, the system reduced and fluoroscopy used to verify positioning.  We irrigated, obtained hemostasis and closed the capsule using #2 ethibond suture.  One gram of vancomycin powder was placed in the surgical bed.   One gram of topical tranexamic acid was injected into the joint.  The fascia was closed with #1 vicryl plus, the deep fat layer was closed with 0 vicryl, the subcutaneous layers closed with 2.0 Vicryl Plus and the skin closed with 2.0 nylon and dermabond. A sterile dressing was applied. The patient was awakened in the operating room and taken to recovery in stable condition.  All sponge, needle, and instrument counts were correct at the end of the case.   Tessa Lerner, my PA, was a medical necessity for opening, closing, limb positioning, retracting, exposing,  and overall facilitation and timely completion of the surgery.  Position: supine  Complications: see description of procedure.  Time Out: performed   Drains/Packing:  none  Estimated blood loss: see anesthesia record  Returned to Recovery Room: in good condition.   Antibiotics: yes   Mechanical VTE (DVT) Prophylaxis: sequential compression devices, TED thigh-high  Chemical VTE (DVT) Prophylaxis: lovenox POD 1, aspirin 81 mg POD 0   Fluid Replacement: see anesthesia record  Specimens Removed: 1 to pathology   Sponge and Instrument Count Correct? yes   PACU: portable radiograph - low AP   Plan/RTC: Return in 2 weeks for staple removal. Weight Bearing/Load Lower Extremity: full  Hip precautions: none Suture Removal: 2 weeks   N. Glee Arvin, MD [facility] 4:06 PM   Implant Name Type Inv. Item Serial No. Manufacturer Lot No. LRB No. Used Action  LINER NEUTRAL 54X36MM PLUS 4 - [id number] Hips LINER NEUTRAL 54X36MM PLUS 4  [employer] [id number] Left 1 Implanted  ACETAB CUP W/GRIPTION 54 - [id number] Plate ACETAB CUP W/GRIPTION 54  [employer] [id number] Left 1 Implanted  SCREW 6.5MMX30MM - [id number] Screw SCREW 6.5MMX30MM  [employer] [id number] Left 1 Implanted  ARTICULEZE HEAD - [id number] Hips ARTICULEZE HEAD  [employer] [id number] Left 1 Implanted  STEM FEM ACTIS STD SZ4 - [id number] Stem STEM FEM ACTIS STD SZ4  [employer] [id number] Left 1 Implanted

## 2022-12-31 NOTE — Progress Notes (Signed)
PROGRESS NOTE    Andrea Wong  ZOX:[id number] DOB: 05-14-1934 DOA: 12/29/2022 PCP: Clovis Riley, L.August Saucer, MD    Brief Narrative:  89 year old with history of type 2 diabetes, GERD, hypertension hyperlipidemia and osteopenia came to emergency room after falling, landing on her left hip and unable to bear weight.  Hemodynamically stable in the ER.  She was found to have left femoral neck fracture and admitted for surgical correction.   Assessment & Plan:   Closed traumatic left hip fracture: Scheduled for total hip 5/22 Dr. Roda Shutters Adequate pain medications SCDs for DVT prophylaxis preop Nonweightbearing until surgery Likely skilled nursing facility placement after the procedure.  Chronic medical issues including Essential hypertension, blood pressure is stable on amlodipine, hydrochlorothiazide and losartan. GERD, on PPI and Pepcid. Hyperlipidemia, on a statin. Type 2 diabetes, well-controlled.  On metformin at home.  Remains on insulin..    DVT prophylaxis: SCDs Start: 12/29/22 1335   Code Status: Full code Family Communication: Husband at the bedside. Disposition Plan: Status is: Inpatient Remains inpatient appropriate because: Inpatient procedure planned     Consultants:  Orthopedics  Procedures:  None  Antimicrobials:  None   Subjective:  Patient seen and examined.  No overnight events.  Anxious about upcoming surgery today.  Objective: Vitals:   12/30/22 1714 12/30/22 2021 12/31/22 0400 12/31/22 0730  BP: (!) 119/50 116/67 123/70 124/62  Pulse: 65 63 (!) 59 61  Resp: 17 16 16 16   Temp: 97.9 F (36.6 C) 98 F (36.7 C) 98 F (36.7 C) (!) 97.5 F (36.4 C)  TempSrc: Oral   Oral  SpO2: 100% 97% 99% 100%  Weight:      Height:       No intake or output data in the 24 hours ending 12/31/22 1154 Filed Weights   12/29/22 0847  Weight: 68 kg    Examination:  General: Looks fairly comfortable.  Laying in the bed. Cardiovascular: S1-S2 normal.  Regular  rate rhythm. Respiratory: Bilateral clear.  No added sounds. Gastrointestinal: Soft.  Nontender.  Bowel sound present. Ext: Left leg Deformed hip.  Range of motion not done.  Distal neurovascular status intact.      Data Reviewed: I have personally reviewed following labs and imaging studies  CBC: Recent Labs  Lab 12/29/22 0917  WBC 8.6  NEUTROABS 7.5  HGB 14.1  HCT 42.0  MCV 87.7  PLT 237    Basic Metabolic Panel: Recent Labs  Lab 12/29/22 0917  NA 134*  K 3.4*  CL 98  CO2 26  GLUCOSE 156*  BUN 9  CREATININE 0.72  CALCIUM 9.4  MG 1.9  PHOS 3.3    GFR: Estimated Creatinine Clearance: 47.8 mL/min (by C-G formula based on SCr of 0.72 mg/dL). Liver Function Tests: No results for input(s): "AST", "ALT", "ALKPHOS", "BILITOT", "PROT", "ALBUMIN" in the last 168 hours. No results for input(s): "LIPASE", "AMYLASE" in the last 168 hours. No results for input(s): "AMMONIA" in the last 168 hours. Coagulation Profile: Recent Labs  Lab 12/29/22 0917  INR 1.0    Cardiac Enzymes: No results for input(s): "CKTOTAL", "CKMB", "CKMBINDEX", "TROPONINI" in the last 168 hours. BNP (last 3 results) No results for input(s): "PROBNP" in the last 8760 hours. HbA1C: Recent Labs    12/30/22 0300 12/30/22 0917  HGBA1C 6.1* 6.1*    CBG: Recent Labs  Lab 12/30/22 0802 12/30/22 1241 12/30/22 1719 12/30/22 2020 12/31/22 0748  GLUCAP 155* 133* 155* 203* 94    Lipid Profile: No results for input(s): "  CHOL", "HDL", "LDLCALC", "TRIG", "CHOLHDL", "LDLDIRECT" in the last 72 hours. Thyroid Function Tests: No results for input(s): "TSH", "T4TOTAL", "FREET4", "T3FREE", "THYROIDAB" in the last 72 hours. Anemia Panel: No results for input(s): "VITAMINB12", "FOLATE", "FERRITIN", "TIBC", "IRON", "RETICCTPCT" in the last 72 hours. Sepsis Labs: No results for input(s): "PROCALCITON", "LATICACIDVEN" in the last 168 hours.  Recent Results (from the past 240 hour(s))  Surgical pcr  screen     Status: None   Collection Time: 12/31/22  4:09 AM   Specimen: Nasal Mucosa; Nasal Swab  Result Value Ref Range Status   MRSA, PCR NEGATIVE NEGATIVE Final   Staphylococcus aureus NEGATIVE NEGATIVE Final    Comment: (NOTE) The Xpert SA Assay (FDA approved for NASAL specimens in patients 55 years of age and older), is one component of a comprehensive surveillance program. It is not intended to diagnose infection nor to guide or monitor treatment. Performed at [facility], [address] [place], [place] [address]          Radiology Studies: No results found.      Scheduled Meds:  amLODipine  5 mg Oral Daily   aspirin EC  81 mg Oral Daily   calcium-vitamin D  1 tablet Oral Daily   famotidine  20 mg Oral Daily   feeding supplement  237 mL Oral BID BM   hydrochlorothiazide  12.5 mg Oral q morning   insulin aspart  0-9 Units Subcutaneous TID WC   losartan  50 mg Oral Daily   metFORMIN  500 mg Oral Q breakfast   multivitamin with minerals  1 tablet Oral Daily   pantoprazole  40 mg Oral Daily   povidone-iodine  2 Application Topical Once   pravastatin  10 mg Oral Daily   senna-docusate  1 tablet Oral BID   tranexamic acid (CYKLOKAPRON) 2,000 mg in sodium chloride 0.9 % 50 mL Topical Application  2,000 mg Topical To OR   Continuous Infusions:   ceFAZolin (ANCEF) IV     tranexamic acid       LOS: 2 days    Time spent: 35 minutes    Dorcas Carrow, MD [facility] Pager [phone]

## 2023-01-01 ENCOUNTER — Encounter (HOSPITAL_COMMUNITY): Payer: Self-pay | Admitting: Orthopaedic Surgery

## 2023-01-01 DIAGNOSIS — S72002A Fracture of unspecified part of neck of left femur, initial encounter for closed fracture: Secondary | ICD-10-CM | POA: Diagnosis not present

## 2023-01-01 LAB — BASIC METABOLIC PANEL
Anion gap: 13 (ref 5–15)
BUN: 17 mg/dL (ref 8–23)
CO2: 21 mmol/L — ABNORMAL LOW (ref 22–32)
Calcium: 8 mg/dL — ABNORMAL LOW (ref 8.9–10.3)
Chloride: 98 mmol/L (ref 98–111)
Creatinine, Ser: 0.69 mg/dL (ref 0.44–1.00)
GFR, Estimated: >60 mL/min (ref >60)
Glucose, Bld: 187 mg/dL — ABNORMAL HIGH (ref 70–99)
Potassium: 3.6 mmol/L (ref 3.5–5.1)
Sodium: 132 mmol/L — ABNORMAL LOW (ref 135–145)

## 2023-01-01 LAB — CBC
HCT: 35.9 % — ABNORMAL LOW (ref 36.0–46.0)
Hemoglobin: 12 g/dL (ref 12.0–15.0)
MCH: 29.1 pg (ref 26.0–34.0)
MCHC: 33.4 g/dL (ref 30.0–36.0)
MCV: 87.1 fL (ref 80.0–100.0)
Platelets: 213 10*3/uL (ref 150–400)
RBC: 4.12 MIL/uL (ref 3.87–5.11)
RDW: 13.6 % (ref 11.5–15.5)
WBC: 12.7 10*3/uL — ABNORMAL HIGH (ref 4.0–10.5)
nRBC: 0 % (ref 0.0–0.2)

## 2023-01-01 LAB — GLUCOSE, CAPILLARY
Glucose-Capillary: 192 mg/dL — ABNORMAL HIGH (ref 70–99)
Glucose-Capillary: 164 mg/dL — ABNORMAL HIGH (ref 70–99)
Glucose-Capillary: 161 mg/dL — ABNORMAL HIGH (ref 70–99)
Glucose-Capillary: 132 mg/dL — ABNORMAL HIGH (ref 70–99)

## 2023-01-01 NOTE — Progress Notes (Signed)
Subjective: 1 Day Post-Op Procedure(s) (LRB): TOTAL HIP ARTHROPLASTY ANTERIOR APPROACH (Left) Patient reports pain as mild.    Objective: Vital signs in last 24 hours: Temp:  [97.4 F (36.3 C)-98.1 F (36.7 C)] 98.1 F (36.7 C) (05/23 0524) Pulse Rate:  [61-84] 71 (05/23 0524) Resp:  [14-18] 18 (05/23 0524) BP: (90-130)/(60-78) 130/66 (05/23 0524) SpO2:  [94 %-97 %] 97 % (05/23 0524) Weight:  [68 kg] 68 kg (05/22 1417)  Intake/Output from previous day: 05/22 0701 - 05/23 0700 In: 1179.7 [I.V.:979.8; IV Piggyback:199.9] Out: 1000 [Urine:800; Blood:200] Intake/Output this shift: No intake/output data recorded.  Recent Labs    12/29/22 0917 12/31/22 1743 01/01/23 0027  HGB 14.1 13.2 12.0   Recent Labs    12/31/22 1743 01/01/23 0027  WBC 12.3* 12.7*  RBC 4.52 4.12  HCT 40.0 35.9*  PLT 223 213   Recent Labs    12/29/22 0917 12/31/22 1743 01/01/23 0027  NA 134*  --  132*  K 3.4*  --  3.6  CL 98  --  98  CO2 26  --  21*  BUN 9  --  17  CREATININE 0.72 0.78 0.69  GLUCOSE 156*  --  187*  CALCIUM 9.4  --  8.0*   Recent Labs    12/29/22 0917  INR 1.0    Neurologically intact Neurovascular intact Sensation intact distally Intact pulses distally Dorsiflexion/Plantar flexion intact Incision: dressing C/D/I No cellulitis present Compartment soft   Assessment/Plan: 1 Day Post-Op Procedure(s) (LRB): TOTAL HIP ARTHROPLASTY ANTERIOR APPROACH (Left) Up with therapy WBAT LLE Dvt ppx- lovenox/scds Oxycodone for pain and rx on chart F/u with xu/Kha Hari 2 weeks po       Cristie Hem 01/01/2023, 7:43 AM

## 2023-01-01 NOTE — Evaluation (Signed)
Physical Therapy Evaluation Patient Details Name: Andrea Wong MRN: [id number] DOB: 11-24-1933 Today's Date: 01/01/2023  History of Present Illness  Pt is an 89 y/o F admitted on 12/29/22 after presenting with c/o a fall & inability to bear weight on LLE. Pt found to have L femoral neck fx & is s/p L THA. PMH: DM2, GERD, HTN, HLD, osteopenia  Clinical Impression  Pt seen for PT evaluation with pt agreeable, family present in room. Prior to admission pt was residing with spouse in 1 level home with 1-2 steps without rails to enter. Pt ambulated with SPC outside of the home (but also held on to her spouse's rollator), RW in the home. On this date, PT provided pt with HEP handout & pt performed supine & seated exercises with cuing for technique & AAROM PRN. Pt requires min assist for supine>sit with use of hospital bed features, mod<>max assist for STS, and min<>mod assist for gait with heavy cuing for gait pattern. Recommend ongoing PT services to address deficits noted below to reduce caregiver burden & decrease fall risk.   Recommendations for follow up therapy are one component of a multi-disciplinary discharge planning process, led by the attending physician.  Recommendations may be updated based on patient status, additional functional criteria and insurance authorization.  Follow Up Recommendations Can patient physically be transported by private vehicle: Yes     Assistance Recommended at Discharge Frequent or constant Supervision/Assistance  Patient can return home with the following  A lot of help with walking and/or transfers;A lot of help with bathing/dressing/bathroom;Assist for transportation;Assistance with cooking/housework;Direct supervision/assist for financial management;Help with stairs or ramp for entrance;Direct supervision/assist for medications management    Equipment Recommendations None recommended by PT (TBD in next venue)  Recommendations for Other Services        Functional Status Assessment Patient has had a recent decline in their functional status and demonstrates the ability to make significant improvements in function in a reasonable and predictable amount of time.     Precautions / Restrictions Precautions Precautions: Fall Restrictions Weight Bearing Restrictions: Yes LLE Weight Bearing: Weight bearing as tolerated      Mobility  Bed Mobility Overal bed mobility: Needs Assistance Bed Mobility: Supine to Sit     Supine to sit: Min assist, HOB elevated     General bed mobility comments: assistance to move LLE to EOB, cuing to use bed rails PRN, HOB elevated    Transfers Overall transfer level: Needs assistance Equipment used: Rolling walker (2 wheels) Transfers: Sit to/from Stand Sit to Stand: Mod assist, Max assist           General transfer comment: STS from EOB with mod/max assist with cuing re: hand placement & to push to standing, assistance to power up    Ambulation/Gait Ambulation/Gait assistance: Min assist Gait Distance (Feet): 40 Feet Assistive device: Rolling walker (2 wheels) Gait Pattern/deviations: Decreased step length - right, Decreased step length - left, Decreased stride length, Decreased dorsiflexion - right, Decreased dorsiflexion - left, Shuffle Gait velocity: decreased     General Gait Details: Pt with shuffled gait pattern, decreased weight shift to L, decreased step length BLE, decreased stride length, decreased heel strike BLE, downward gaze. PT provides ongoing cuing & pt with fair return demo for upright posture, forward gaze, increased step length BLE, increased foot clearance, RW management during gait. Pt requires extra time to maneuver RW through doorway.  Stairs            Wheelchair  Mobility    Modified Rankin (Stroke Patients Only)       Balance Overall balance assessment: Needs assistance Sitting-balance support: Feet supported, Bilateral upper extremity supported Sitting  balance-Leahy Scale: Poor Sitting balance - Comments: Pt with poor balance while sitting EOB, leaning to R, but able to correct herself with supervision.   Standing balance support: Bilateral upper extremity supported, During functional activity, Reliant on assistive device for balance Standing balance-Leahy Scale: Poor                               Pertinent Vitals/Pain Pain Assessment Pain Assessment: 0-10 Pain Score: 8  Pain Location: 0/10 at rest, up to 8/10 with movement, per pt Pain Descriptors / Indicators: Discomfort, Grimacing, Guarding Pain Intervention(s): Monitored during session, Limited activity within patient's tolerance, Repositioned    Home Living Family/patient expects to be discharged to:: Private residence Living Arrangements: Spouse/significant other Available Help at Discharge: Family;Available PRN/intermittently (husband cannot provide physical assistance & daughter cannot provide 24 hr assistance) Type of Home: House Home Access: Stairs to enter Entrance Stairs-Rails: None Entrance Stairs-Number of Steps: 1-2   Home Layout: One level Home Equipment: Agricultural consultant (2 wheels);Cane - single point      Prior Function Prior Level of Function : Independent/Modified Independent             Mobility Comments: Ambulated with RW in the home, SPC outside of the home (also held on to her husband's rollator as she walked), no falls but daughter reports pt is fearful of falling.       Hand Dominance        Extremity/Trunk Assessment   Upper Extremity Assessment Upper Extremity Assessment: Generalized weakness    Lower Extremity Assessment Lower Extremity Assessment: Generalized weakness;LLE deficits/detail LLE Deficits / Details: 2+/5 knee extension in sitting       Communication   Communication: No difficulties  Cognition Arousal/Alertness: Awake/alert Behavior During Therapy: WFL for tasks assessed/performed Overall Cognitive  Status: Within Functional Limits for tasks assessed                                 General Comments: Pt follows simple commands throughout session but appears to demonstrate decreased safety awareness, slow processing.        General Comments      Exercises Total Joint Exercises Ankle Circles/Pumps: AROM, Supine, Left, 10 reps Quad Sets: AROM, Supine, Strengthening, Left, 10 reps Short Arc Quad: AROM, Supine, Strengthening, Left, 10 reps Heel Slides: AAROM, Supine, Strengthening, Left, 10 reps Hip ABduction/ADduction: Supine, AAROM, Strengthening, Left, 10 reps (hip abduction slides) Long Arc Quad: AROM, Seated, Strengthening, Left, 10 reps   Assessment/Plan    PT Assessment Patient needs continued PT services  PT Problem List Decreased strength;Pain;Decreased range of motion;Decreased activity tolerance;Decreased balance;Decreased mobility;Decreased safety awareness;Decreased knowledge of use of DME;Decreased cognition       PT Treatment Interventions DME instruction;Therapeutic exercise;Gait training;Balance training;Stair training;Neuromuscular re-education;Functional mobility training;Therapeutic activities;Patient/family education;Modalities;Manual techniques;Cognitive remediation    PT Goals (Current goals can be found in the Care Plan section)  Acute Rehab PT Goals Patient Stated Goal: get better, go home PT Goal Formulation: With patient Time For Goal Achievement: 01/15/23 Potential to Achieve Goals: Fair    Frequency Min 3X/week     Co-evaluation               AM-PAC PT "6  Clicks" Mobility  Outcome Measure Help needed turning from your back to your side while in a flat bed without using bedrails?: A Little Help needed moving from lying on your back to sitting on the side of a flat bed without using bedrails?: A Little Help needed moving to and from a bed to a chair (including a wheelchair)?: A Little Help needed standing up from a chair  using your arms (e.g., wheelchair or bedside chair)?: A Lot Help needed to walk in hospital room?: A Lot Help needed climbing 3-5 steps with a railing? : A Lot 6 Click Score: 15    End of Session Equipment Utilized During Treatment: Gait belt Activity Tolerance: Patient tolerated treatment well Patient left: in chair;with chair alarm set;with call bell/phone within reach;with family/visitor present Nurse Communication: Mobility status PT Visit Diagnosis: Muscle weakness (generalized) (M62.81);Unsteadiness on feet (R26.81);Other abnormalities of gait and mobility (R26.89);Difficulty in walking, not elsewhere classified (R26.2);Pain Pain - Right/Left: Left Pain - part of body: Hip    Time: 1610-9604 PT Time Calculation (min) (ACUTE ONLY): 22 min   Charges:   PT Evaluation $PT Eval Low Complexity: 1 Low PT Treatments $Therapeutic Exercise: 8-22 mins        Aleda Grana, PT, DPT 01/01/23, 4:34 PM   Sandi Mariscal 01/01/2023, 4:32 PM

## 2023-01-01 NOTE — Progress Notes (Signed)
PROGRESS NOTE    Andrea Wong  ZOX:[id number] DOB: 05/25/1934 DOA: 12/29/2022 PCP: Clovis Riley, L.August Saucer, MD    Brief Narrative:  89 year old with history of type 2 diabetes, GERD, hypertension hyperlipidemia and osteopenia came to emergency room after falling, landing on her left hip and unable to bear weight.  Hemodynamically stable in the ER.  She was found to have left femoral neck fracture and admitted for surgical correction.   Assessment & Plan:   Closed traumatic left hip fracture: Status post left total hip arthroplasty 5/22. Adequate pain medications SCDs/Lovenox for DVT prophylaxis preop Weightbearing as tolerated left lower extremity. Work with PT OT today.   Chronic medical issues including Essential hypertension, blood pressure is stable on amlodipine, hydrochlorothiazide and losartan. GERD, on PPI and Pepcid. Hyperlipidemia, on a statin. Type 2 diabetes, well-controlled.  On metformin at home.  Remains on insulin..    DVT prophylaxis: enoxaparin (LOVENOX) injection 40 mg Start: 01/01/23 1600 SCDs Start: 12/31/22 1731 Place TED hose Start: 12/31/22 1731 SCDs Start: 12/29/22 1335   Code Status: Full code Family Communication: Husband and daughter at the bedside. Disposition Plan: Status is: Inpatient Remains inpatient appropriate because: Immediate postop.     Consultants:  Orthopedics  Procedures:  Left total hip  Antimicrobials:  None   Subjective:  Seen and examined.  No overnight events.  Minimal pain left hip.  Not mobilized yet.  Objective: Vitals:   12/31/22 1941 01/01/23 0037 01/01/23 0524 01/01/23 0823  BP: 128/65 123/60 130/66 (!) 128/59  Pulse: 74 76 71 73  Resp: 18 18 18 16   Temp: (!) 97.4 F (36.3 C) 97.7 F (36.5 C) 98.1 F (36.7 C) 98.6 F (37 C)  TempSrc:  Oral Oral Oral  SpO2: 97% 97% 97% 99%  Weight:      Height:        Intake/Output Summary (Last 24 hours) at 01/01/2023 1325 Last data filed at 01/01/2023  1309 Gross per 24 hour  Intake 1419.69 ml  Output 1250 ml  Net 169.69 ml   Filed Weights   12/29/22 0847 12/31/22 1417  Weight: 68 kg 68 kg    Examination:  General: Looks fairly comfortable.  Laying in the bed. Cardiovascular: S1-S2 normal.  Regular rate rhythm. Respiratory: Bilateral clear.  No added sounds. Gastrointestinal: Soft.  Nontender.  Bowel sound present. Ext: Left lateral thigh incision clean and dry.  Distal neurovascular status intact.      Data Reviewed: I have personally reviewed following labs and imaging studies  CBC: Recent Labs  Lab 12/29/22 0917 12/31/22 1743 01/01/23 0027  WBC 8.6 12.3* 12.7*  NEUTROABS 7.5  --   --   HGB 14.1 13.2 12.0  HCT 42.0 40.0 35.9*  MCV 87.7 88.5 87.1  PLT 237 223 213    Basic Metabolic Panel: Recent Labs  Lab 12/29/22 0917 12/31/22 1743 01/01/23 0027  NA 134*  --  132*  K 3.4*  --  3.6  CL 98  --  98  CO2 26  --  21*  GLUCOSE 156*  --  187*  BUN 9  --  17  CREATININE 0.72 0.78 0.69  CALCIUM 9.4  --  8.0*  MG 1.9  --   --   PHOS 3.3  --   --     GFR: Estimated Creatinine Clearance: 47.8 mL/min (by C-G formula based on SCr of 0.69 mg/dL). Liver Function Tests: No results for input(s): "AST", "ALT", "ALKPHOS", "BILITOT", "PROT", "ALBUMIN" in the last 168 hours. No  results for input(s): "LIPASE", "AMYLASE" in the last 168 hours. No results for input(s): "AMMONIA" in the last 168 hours. Coagulation Profile: Recent Labs  Lab 12/29/22 0917  INR 1.0    Cardiac Enzymes: No results for input(s): "CKTOTAL", "CKMB", "CKMBINDEX", "TROPONINI" in the last 168 hours. BNP (last 3 results) No results for input(s): "PROBNP" in the last 8760 hours. HbA1C: Recent Labs    12/30/22 0300 12/30/22 0917  HGBA1C 6.1* 6.1*    CBG: Recent Labs  Lab 12/31/22 1644 12/31/22 1703 12/31/22 2113 01/01/23 0821 01/01/23 1213  GLUCAP 107* 118* 202* 192* 164*    Lipid Profile: No results for input(s): "CHOL",  "HDL", "LDLCALC", "TRIG", "CHOLHDL", "LDLDIRECT" in the last 72 hours. Thyroid Function Tests: No results for input(s): "TSH", "T4TOTAL", "FREET4", "T3FREE", "THYROIDAB" in the last 72 hours. Anemia Panel: No results for input(s): "VITAMINB12", "FOLATE", "FERRITIN", "TIBC", "IRON", "RETICCTPCT" in the last 72 hours. Sepsis Labs: No results for input(s): "PROCALCITON", "LATICACIDVEN" in the last 168 hours.  Recent Results (from the past 240 hour(s))  Surgical pcr screen     Status: None   Collection Time: 12/31/22  4:09 AM   Specimen: Nasal Mucosa; Nasal Swab  Result Value Ref Range Status   MRSA, PCR NEGATIVE NEGATIVE Final   Staphylococcus aureus NEGATIVE NEGATIVE Final    Comment: (NOTE) The Xpert SA Assay (FDA approved for NASAL specimens in patients 64 years of age and older), is one component of a comprehensive surveillance program. It is not intended to diagnose infection nor to guide or monitor treatment. Performed at [facility], [address] [place], [place] [address]          Radiology Studies: DG Pelvis Portable  Result Date: 12/31/2022 CLINICAL DATA:  Left hip arthroplasty EXAM: PORTABLE PELVIS 1-2 VIEWS COMPARISON:  12/29/2022 FINDINGS: Frontal view of the lower pelvis and bilateral hips was performed. Left hip arthroplasty is identified in the expected position without evidence of acute complication. Postsurgical changes in the overlying soft tissues. Visualized portions of the bony pelvis and right hip are unremarkable. IMPRESSION: 1. Unremarkable left hip arthroplasty. Electronically Signed   By: Sharlet Salina M.D.   On: 12/31/2022 21:24   DG HIP UNILAT WITH PELVIS 1V LEFT  Result Date: 12/31/2022 CLINICAL DATA:  Total left hip arthroplasty. Intraoperative fluoroscopy. EXAM: DG HIP (WITH OR WITHOUT PELVIS) 1V*L* COMPARISON:  Pelvis and left hip radiographs 12/29/2022, CT left hip 12/29/2022 FINDINGS: Images were performed intraoperatively without the  presence of a radiologist. Interval total left hip arthroplasty. No hardware complication is seen. Total fluoroscopy images: 2 Total fluoroscopy time: 23 seconds Total dose: Radiation Exposure Index (as provided by the fluoroscopic device): 2.38 mGy air Kerma Please see intraoperative findings for further detail. IMPRESSION: Intraoperative fluoroscopy provided for left hip arthroplasty. Electronically Signed   By: Neita Garnet M.D.   On: 12/31/2022 16:19   DG C-Arm 1-60 Min-No Report  Result Date: 12/31/2022 Fluoroscopy was utilized by the requesting physician.  No radiographic interpretation.   DG C-Arm 1-60 Min-No Report  Result Date: 12/31/2022 Fluoroscopy was utilized by the requesting physician.  No radiographic interpretation.        Scheduled Meds:  amLODipine  5 mg Oral Daily   aspirin EC  81 mg Oral Daily   calcium-vitamin D  1 tablet Oral QHS   docusate sodium  100 mg Oral BID   enoxaparin (LOVENOX) injection  40 mg Subcutaneous Q24H   famotidine  20 mg Oral Daily   feeding  supplement  237 mL Oral BID BM   hydrochlorothiazide  12.5 mg Oral q morning   insulin aspart  0-9 Units Subcutaneous TID WC   losartan  50 mg Oral Daily   metFORMIN  500 mg Oral Q breakfast   multivitamin with minerals  1 tablet Oral Daily   pantoprazole  40 mg Oral Daily   pravastatin  10 mg Oral QHS   senna-docusate  1 tablet Oral BID   Continuous Infusions:  sodium chloride 75 mL/hr at 01/01/23 0843   lactated ringers     lactated ringers 10 mL/hr at 12/31/22 1423   methocarbamol (ROBAXIN) IV       LOS: 3 days    Time spent: 35 minutes    Dorcas Carrow, MD [facility] Pager [phone]

## 2023-01-01 NOTE — Evaluation (Signed)
Occupational Therapy Evaluation Patient Details Name: Andrea Wong MRN: [id number] DOB: 1934/04/07 Today's Date: 01/01/2023   History of Present Illness Pt is an 89 y/o F admitted on 12/29/22 after presenting with c/o a fall & inability to bear weight on LLE. Pt found to have L femoral neck fx & is s/p L THA. PMH: DM2, GERD, HTN, HLD, osteopenia   Clinical Impression   Pt admitted for above dx, PTA patient reports being independent in bADLs/iADLs and using SPC or Rollator to ambulate depending on need and if in community or at home. Pt currently limited by LLE pain with functional mobility resulting in decreased activity tolerance and impaired balance. Pt needing Mod A and cues for hand placement with STS, husband unable to provide necessary physical assist. Pt also needing assist with lower body dressing and bathing due to difficulty reaching LLE. Cues needed for gait pattern with LLE and RW management as patient needs to be closer to RW. Pt would benefit from continued acute skilled OT to address above deficits and help transition to next level of care. Patient would benefit from post acute skilled rehab facility with <3 hours of therapy and 24/7 support      Recommendations for follow up therapy are one component of a multi-disciplinary discharge planning process, led by the attending physician.  Recommendations may be updated based on patient status, additional functional criteria and insurance authorization.   Assistance Recommended at Discharge Frequent or constant Supervision/Assistance  Patient can return home with the following A lot of help with walking and/or transfers;A lot of help with bathing/dressing/bathroom;Assistance with cooking/housework;Help with stairs or ramp for entrance;Assist for transportation    Functional Status Assessment  Patient has had a recent decline in their functional status and demonstrates the ability to make significant improvements in function in a  reasonable and predictable amount of time.  Equipment Recommendations  None recommended by OT (defer to next level of care)    Recommendations for Other Services       Precautions / Restrictions Precautions Precautions: Fall Restrictions Weight Bearing Restrictions: Yes LLE Weight Bearing: Weight bearing as tolerated      Mobility Bed Mobility               General bed mobility comments: pt rec'd and left sitting in recliner, unable to fully assess need for assist    Transfers Overall transfer level: Needs assistance Equipment used: Rolling walker (2 wheels) Transfers: Sit to/from Stand Sit to Stand: Mod assist           General transfer comment: with use of arm rest and cues for hand placement to push up to standing      Balance       Sitting balance - Comments: unable to fully assess, pt in recliner   Standing balance support: Bilateral upper extremity supported, During functional activity, Reliant on assistive device for balance Standing balance-Leahy Scale: Poor                             ADL either performed or assessed with clinical judgement   ADL       Grooming: Standing;Minimal assistance       Lower Body Bathing: Sitting/lateral leans;Moderate assistance       Lower Body Dressing: Sitting/lateral leans;Maximal assistance Lower Body Dressing Details (indicate cue type and reason): Pt able to don/doff R sock with increased time and challenge min guard, Max A to don/doff L  sock Toilet Transfer: Minimal assistance;Ambulation;Rolling walker (2 wheels)           Functional mobility during ADLs: Minimal assistance;Rolling walker (2 wheels) General ADL Comments: Took steps at bedside     Vision Baseline Vision/History: 1 Wears glasses       Perception     Praxis      Pertinent Vitals/Pain Pain Assessment Pain Assessment: 0-10 Pain Score: 8  Pain Location: 0/10 at rest, up to 8/10 with movement Pain Descriptors /  Indicators: Discomfort, Grimacing, Guarding Pain Intervention(s): Limited activity within patient's tolerance, Monitored during session, Repositioned     Hand Dominance     Extremity/Trunk Assessment Upper Extremity Assessment Upper Extremity Assessment: Generalized weakness   Lower Extremity Assessment Lower Extremity Assessment: Generalized weakness LLE Deficits / Details: 2+/5 knee extension in sitting. Pt able to partially lift LLE off ground with increased time but limited by pain       Communication Communication Communication: No difficulties   Cognition Arousal/Alertness: Awake/alert Behavior During Therapy: WFL for tasks assessed/performed Overall Cognitive Status: Within Functional Limits for tasks assessed                                       General Comments  VSS on RA    Exercises     Shoulder Instructions      Home Living Family/patient expects to be discharged to:: Private residence Living Arrangements: Spouse/significant other Available Help at Discharge: Family;Available PRN/intermittently (husband cannot provide physical assistance & daughter cannot provide 24 hr assistance) Type of Home: House Home Access: Stairs to enter Entrance Stairs-Number of Steps: 1-2 Entrance Stairs-Rails: None Home Layout: One level     Bathroom Shower/Tub: Producer, television/film/video: Standard Bathroom Accessibility: Yes How Accessible: Accessible via walker Home Equipment: Rolling Walker (2 wheels);Cane - single point;Shower seat;Grab bars - tub/shower          Prior Functioning/Environment Prior Level of Function : Independent/Modified Independent             Mobility Comments: Ambulated with RW in the home, SPC outside of the home (also held on to her husband's rollator as she walked), no falls but daughter reports pt is fearful of falling. ADLs Comments: ind in bADLs/iADLs        OT Problem List: Decreased strength;Decreased  activity tolerance;Impaired balance (sitting and/or standing);Pain      OT Treatment/Interventions: Self-care/ADL training;DME and/or AE instruction;Patient/family education;Therapeutic activities;Balance training;Therapeutic exercise    OT Goals(Current goals can be found in the care plan section) Acute Rehab OT Goals Patient Stated Goal: to walk OT Goal Formulation: With patient Time For Goal Achievement: 01/15/23 Potential to Achieve Goals: Good ADL Goals Pt Will Perform Grooming: standing;with min guard assist Pt Will Perform Lower Body Bathing: sitting/lateral leans;with adaptive equipment;with supervision;with set-up Pt Will Perform Lower Body Dressing: with set-up;with supervision;sitting/lateral leans;with adaptive equipment Pt Will Transfer to Toilet: ambulating;with supervision  OT Frequency: Min 2X/week    Co-evaluation              AM-PAC OT "6 Clicks" Daily Activity     Outcome Measure Help from another person eating meals?: None Help from another person taking care of personal grooming?: A Little Help from another person toileting, which includes using toliet, bedpan, or urinal?: A Little Help from another person bathing (including washing, rinsing, drying)?: A Lot Help from another person to put on and  taking off regular upper body clothing?: A Little Help from another person to put on and taking off regular lower body clothing?: A Lot 6 Click Score: 17   End of Session Equipment Utilized During Treatment: Gait belt;Rolling walker (2 wheels) Nurse Communication: Mobility status  Activity Tolerance: Patient tolerated treatment well Patient left: in chair;with call bell/phone within reach;with chair alarm set;with family/visitor present  OT Visit Diagnosis: Unsteadiness on feet (R26.81);Pain;Muscle weakness (generalized) (M62.81) Pain - Right/Left: Left Pain - part of body: Leg                Time: 1027-2536 OT Time Calculation (min): 25 min Charges:  OT  General Charges $OT Visit: 1 Visit OT Evaluation $OT Eval Moderate Complexity: 1 Mod OT Treatments $Therapeutic Activity: 8-22 mins  01/01/2023  AB, OTR/L  Acute Rehabilitation Services  Office: [phone]   Tristan Schroeder 01/01/2023, 4:42 PM

## 2023-01-02 DIAGNOSIS — S72002A Fracture of unspecified part of neck of left femur, initial encounter for closed fracture: Secondary | ICD-10-CM | POA: Diagnosis not present

## 2023-01-02 DIAGNOSIS — E119 Type 2 diabetes mellitus without complications: Secondary | ICD-10-CM | POA: Diagnosis not present

## 2023-01-02 DIAGNOSIS — J309 Allergic rhinitis, unspecified: Secondary | ICD-10-CM | POA: Diagnosis not present

## 2023-01-02 DIAGNOSIS — D649 Anemia, unspecified: Secondary | ICD-10-CM | POA: Diagnosis not present

## 2023-01-02 DIAGNOSIS — S72009A Fracture of unspecified part of neck of unspecified femur, initial encounter for closed fracture: Secondary | ICD-10-CM | POA: Diagnosis not present

## 2023-01-02 DIAGNOSIS — I10 Essential (primary) hypertension: Secondary | ICD-10-CM | POA: Diagnosis not present

## 2023-01-02 DIAGNOSIS — E7800 Pure hypercholesterolemia, unspecified: Secondary | ICD-10-CM | POA: Diagnosis not present

## 2023-01-02 DIAGNOSIS — K219 Gastro-esophageal reflux disease without esophagitis: Secondary | ICD-10-CM | POA: Diagnosis not present

## 2023-01-02 DIAGNOSIS — M8580 Other specified disorders of bone density and structure, unspecified site: Secondary | ICD-10-CM | POA: Diagnosis not present

## 2023-01-02 DIAGNOSIS — K5900 Constipation, unspecified: Secondary | ICD-10-CM | POA: Diagnosis not present

## 2023-01-02 DIAGNOSIS — S72042D Displaced fracture of base of neck of left femur, subsequent encounter for closed fracture with routine healing: Secondary | ICD-10-CM | POA: Diagnosis not present

## 2023-01-02 DIAGNOSIS — R262 Difficulty in walking, not elsewhere classified: Secondary | ICD-10-CM | POA: Diagnosis not present

## 2023-01-02 DIAGNOSIS — E569 Vitamin deficiency, unspecified: Secondary | ICD-10-CM | POA: Diagnosis not present

## 2023-01-02 DIAGNOSIS — Z7401 Bed confinement status: Secondary | ICD-10-CM | POA: Diagnosis not present

## 2023-01-02 DIAGNOSIS — E1169 Type 2 diabetes mellitus with other specified complication: Secondary | ICD-10-CM | POA: Diagnosis not present

## 2023-01-02 DIAGNOSIS — E876 Hypokalemia: Secondary | ICD-10-CM | POA: Diagnosis not present

## 2023-01-02 LAB — GLUCOSE, CAPILLARY
Glucose-Capillary: 130 mg/dL — ABNORMAL HIGH (ref 70–99)
Glucose-Capillary: 157 mg/dL — ABNORMAL HIGH (ref 70–99)

## 2023-01-02 LAB — BASIC METABOLIC PANEL
Anion gap: 10 (ref 5–15)
BUN: 11 mg/dL (ref 8–23)
CO2: 24 mmol/L (ref 22–32)
Calcium: 8.4 mg/dL — ABNORMAL LOW (ref 8.9–10.3)
Chloride: 98 mmol/L (ref 98–111)
Creatinine, Ser: 0.53 mg/dL (ref 0.44–1.00)
GFR, Estimated: >60 mL/min (ref >60)
Glucose, Bld: 142 mg/dL — ABNORMAL HIGH (ref 70–99)
Potassium: 3.2 mmol/L — ABNORMAL LOW (ref 3.5–5.1)
Sodium: 132 mmol/L — ABNORMAL LOW (ref 135–145)

## 2023-01-02 LAB — CBC
HCT: 36.3 % (ref 36.0–46.0)
Hemoglobin: 12.3 g/dL (ref 12.0–15.0)
MCH: 29.6 pg (ref 26.0–34.0)
MCHC: 33.9 g/dL (ref 30.0–36.0)
MCV: 87.3 fL (ref 80.0–100.0)
Platelets: 205 10*3/uL (ref 150–400)
RBC: 4.16 MIL/uL (ref 3.87–5.11)
RDW: 13.7 % (ref 11.5–15.5)
WBC: 12.1 10*3/uL — ABNORMAL HIGH (ref 4.0–10.5)
nRBC: 0 % (ref 0.0–0.2)

## 2023-01-02 MED ORDER — POTASSIUM CHLORIDE CRYS ER 20 MEQ PO TBCR: 20.0000 meq | EXTENDED_RELEASE_TABLET | Freq: Every day | ORAL | Status: AC

## 2023-01-02 MED ORDER — POTASSIUM CHLORIDE CRYS ER 20 MEQ PO TBCR
40.0000 meq | EXTENDED_RELEASE_TABLET | Freq: Two times a day (BID) | ORAL | Status: DC
  Administered 2023-01-02: 40 meq via ORAL
  Filled 2023-01-02: qty 2

## 2023-01-02 MED ORDER — ACETAMINOPHEN 325 MG PO TABS: 650.0000 mg | ORAL_TABLET | Freq: Four times a day (QID) | ORAL | Status: AC | PRN

## 2023-01-02 MED ORDER — DOCUSATE SODIUM 100 MG PO CAPS: 100.0000 mg | ORAL_CAPSULE | Freq: Two times a day (BID) | ORAL | 0 refills | Status: AC

## 2023-01-02 NOTE — Progress Notes (Signed)
Nicole Cella is doing well this morning. She's mobilizing slowly with PT.  Surgical site looks good. Stable from ortho stand point.   Weightbearing: WBAT LLE Insicional and dressing care: Dressings left intact until follow-up Orthopedic device(s): None Showering: Light shower with current bandage VTE prophylaxis: Lovenox 40mg  qd  2 weeks Pain control: Percocet Follow - up plan: 2 weeks Contact information:  Tessa Lerner, PA [address]

## 2023-01-02 NOTE — Plan of Care (Signed)
  Problem: Education: Goal: Ability to describe self-care measures that may prevent or decrease complications (Diabetes Survival Skills Education) will improve Outcome: Adequate for Discharge Goal: Individualized Educational Video(s) Outcome: Adequate for Discharge   Problem: Coping: Goal: Ability to adjust to condition or change in health will improve Outcome: Adequate for Discharge   Problem: Fluid Volume: Goal: Ability to maintain a balanced intake and output will improve Outcome: Adequate for Discharge   Problem: Health Behavior/Discharge Planning: Goal: Ability to identify and utilize available resources and services will improve Outcome: Adequate for Discharge Goal: Ability to manage health-related needs will improve Outcome: Adequate for Discharge   Problem: Metabolic: Goal: Ability to maintain appropriate glucose levels will improve Outcome: Adequate for Discharge   Problem: Nutritional: Goal: Maintenance of adequate nutrition will improve Outcome: Adequate for Discharge Goal: Progress toward achieving an optimal weight will improve Outcome: Adequate for Discharge   Problem: Skin Integrity: Goal: Risk for impaired skin integrity will decrease Outcome: Adequate for Discharge   Problem: Tissue Perfusion: Goal: Adequacy of tissue perfusion will improve Outcome: Adequate for Discharge   Problem: Education: Goal: Knowledge of General Education information will improve Description: Including pain rating scale, medication(s)/side effects and non-pharmacologic comfort measures Outcome: Adequate for Discharge   Problem: Health Behavior/Discharge Planning: Goal: Ability to manage health-related needs will improve Outcome: Adequate for Discharge   Problem: Clinical Measurements: Goal: Ability to maintain clinical measurements within normal limits will improve Outcome: Adequate for Discharge Goal: Will remain free from infection Outcome: Adequate for Discharge Goal:  Diagnostic test results will improve Outcome: Adequate for Discharge Goal: Respiratory complications will improve Outcome: Adequate for Discharge Goal: Cardiovascular complication will be avoided Outcome: Adequate for Discharge   Problem: Activity: Goal: Risk for activity intolerance will decrease Outcome: Adequate for Discharge   Problem: Nutrition: Goal: Adequate nutrition will be maintained Outcome: Adequate for Discharge   Problem: Coping: Goal: Level of anxiety will decrease Outcome: Adequate for Discharge   Problem: Elimination: Goal: Will not experience complications related to bowel motility Outcome: Adequate for Discharge Goal: Will not experience complications related to urinary retention Outcome: Adequate for Discharge   Problem: Pain Managment: Goal: General experience of comfort will improve Outcome: Adequate for Discharge   Problem: Safety: Goal: Ability to remain free from injury will improve Outcome: Adequate for Discharge   Problem: Skin Integrity: Goal: Risk for impaired skin integrity will decrease Outcome: Adequate for Discharge   Problem: Education: Goal: Verbalization of understanding the information provided (i.e., activity precautions, restrictions, etc) will improve Outcome: Adequate for Discharge Goal: Individualized Educational Video(s) Outcome: Adequate for Discharge   Problem: Activity: Goal: Ability to ambulate and perform ADLs will improve Outcome: Adequate for Discharge   Problem: Clinical Measurements: Goal: Postoperative complications will be avoided or minimized Outcome: Adequate for Discharge   Problem: Self-Concept: Goal: Ability to maintain and perform role responsibilities to the fullest extent possible will improve Outcome: Adequate for Discharge   Problem: Pain Management: Goal: Pain level will decrease Outcome: Adequate for Discharge   

## 2023-01-02 NOTE — Progress Notes (Signed)
Pt. to be discharged to [facility] via [facility] at this time. PIV in RAC D/C'd and dry dressing placed. Catheter tip intact and hemostasis achieved within expected time frame. All belongings sent with pt.'s daughter, Celine Mans. Report called to [place], LPN.

## 2023-01-02 NOTE — Care Management Important Message (Signed)
Important Message  Patient Details  Name: Andrea Wong MRN: [id number] Date of Birth: [birth year]   Medicare Important Message Given:  Yes     Sherilyn Banker 01/02/2023, 3:05 PM

## 2023-01-02 NOTE — TOC Transition Note (Addendum)
Transition of Care [facility]) - CM/SW Discharge Note   Patient Details  Name: Andrea Wong MRN: [id number] Date of Birth: 1934/07/23  [facility]) CM/SW Contact:  Carley Hammed, LCSW Phone Number: 01/02/2023, 1:25 PM   Clinical Narrative:    Pt to be transported to [facility] via [facility]. Nurse to call report to [phone]. Rm# [id number] Pas[place] [phone]   Final next level of care: Skilled Nursing Facility Barriers to Discharge: Barriers Resolved   Patient Goals and CMS Choice CMS Medicare.gov Compare Post Acute Care list provided to:: Patient Choice offered to / list presented to : Patient  Discharge Placement                Patient chooses bed at: [facility] Patient to be transferred to facility by: [facility] Name of family member notified: Tami Patient and family notified of of transfer: 01/02/23  Discharge Plan and Services Additional resources added to the After Visit Summary for     Discharge Planning Services: CM Consult Post Acute Care Choice: Skilled Nursing Facility          DME Arranged: N/A (await post op PT recommendations)         HH Arranged:  (await Post op PT recommendations)          Social Determinants of Health (SDOH) Interventions SDOH Screenings   Tobacco Use: Low Risk  (01/01/2023)     Readmission Risk Interventions     No data to display

## 2023-01-02 NOTE — TOC CM/SW Note (Signed)
Called [facility] spoke to [facility], was not given estimated time of pick up.

## 2023-01-02 NOTE — Discharge Summary (Signed)
Physician Discharge Summary  Andrea Wong:[id number] DOB: [birth year] DOA: 12/29/2022  PCP: Clovis Riley, L.August Saucer, MD  Admit date: 12/29/2022 Discharge date: 01/02/2023  Admitted From: Home Disposition: Skilled nursing facility  Recommendations for Outpatient Follow-up:  Follow up with PCP in 1-2 weeks Please obtain BMP/CBC in one week Orthopedics to schedule follow-up  Home Health: N/A Equipment/Devices: N/A  Discharge Condition: Stable CODE STATUS: Full code Diet recommendation: Low-salt and low-carb diet  Discharge summary: 89 year old with history of type 2 diabetes, GERD, hypertension hyperlipidemia and osteopenia came to emergency room after falling, landing on her left hip and unable to bear weight.  Hemodynamically stable in the ER.  She was found to have left femoral neck fracture and admitted for surgical correction.     Assessment & plan of care:   Closed traumatic left hip fracture: Status post left total hip arthroplasty 5/22, Dr Roda Shutters Adequate pain medications, pain managed with Tylenol along with Percocet.  Continue bowel regimen. Lovenox 40 mg daily for 2 weeks. Weightbearing as tolerated left lower extremity. Surgery to schedule follow-up.    Chronic medical issues including Essential hypertension, blood pressure is stable on amlodipine, hydrochlorothiazide and losartan. GERD, on PPI and Pepcid. Hyperlipidemia, on statin. Type 2 diabetes, well-controlled.  On metformin at home.  Resume on discharge.  Stable to discharge to skilled level of care.    Discharge Diagnoses:  Principal Problem:   Traumatic closed displaced fracture of neck of left femur, initial encounter [facility]) Active Problems:   Essential hypertension   Gastroesophageal reflux disease   Hypercholesterolemia   Osteopenia   Type 2 diabetes mellitus [facility])    Discharge Instructions  Discharge Instructions     Call MD for:  redness, tenderness, or signs of infection (pain, swelling,  redness, odor or green/yellow discharge around incision site)   Complete by: As directed    Call MD for:  severe uncontrolled pain   Complete by: As directed    Diet - low sodium heart healthy   Complete by: As directed    Diet Carb Modified   Complete by: As directed    Increase activity slowly   Complete by: As directed    Weight bearing as tolerated   Complete by: As directed       Allergies as of 01/02/2023       Reactions   Codeine    "Hypes" pt up.   Lisinopril Cough        Medication List     STOP taking these medications    diclofenac 75 MG EC tablet Commonly known as: VOLTAREN   HYDROcodone-acetaminophen 5-325 MG tablet Commonly known as: Norco   meloxicam 7.5 MG tablet Commonly known as: Mobic   pantoprazole 40 MG tablet Commonly known as: PROTONIX   predniSONE 10 MG (21) Tbpk tablet Commonly known as: STERAPRED UNI-PAK 21 TAB   traMADol 50 MG tablet Commonly known as: ULTRAM       TAKE these medications    acetaminophen 325 MG tablet Commonly known as: TYLENOL Take 2 tablets (650 mg total) by mouth every 6 (six) hours as needed for mild pain (or Fever >/= 101).   amLODipine 5 MG tablet Commonly known as: NORVASC Take 5 mg by mouth daily.   aspirin EC 81 MG tablet Take 81 mg by mouth daily.   calcium-vitamin D 250-100 MG-UNIT tablet Take 1 tablet by mouth 2 (two) times daily.   docusate sodium 100 MG capsule Commonly known as: COLACE Take 1 capsule (100 mg  total) by mouth 2 (two) times daily.   enoxaparin 40 MG/0.4ML injection Commonly known as: LOVENOX Inject 0.4 mLs (40 mg total) into the skin daily for 14 days.   famotidine 20 MG tablet Commonly known as: PEPCID Take 20 mg by mouth daily.   fexofenadine 180 MG tablet Commonly known as: ALLEGRA Take 180 mg by mouth daily as needed for allergies.   Fish Oil 1000 MG Caps Take by mouth.   fluticasone 27.5 MCG/SPRAY nasal spray Commonly known as: VERAMYST Place 2 sprays  into the nose daily.   hydrochlorothiazide 12.5 MG tablet Commonly known as: HYDRODIURIL Take 12.5 mg by mouth every morning.   losartan 50 MG tablet Commonly known as: COZAAR Take 50 mg by mouth daily.   metFORMIN 500 MG tablet Commonly known as: GLUCOPHAGE Take 500 mg by mouth daily.   multivitamin tablet Take 1 tablet by mouth daily.   omeprazole 20 MG capsule Commonly known as: PRILOSEC Take 20 mg by mouth 2 (two) times daily before a meal.   oxyCODONE-acetaminophen 5-325 MG tablet Commonly known as: Percocet Take 1-2 tablets by mouth 2 (two) times daily as needed for severe pain.   potassium chloride SA 20 MEQ tablet Commonly known as: KLOR-CON M Take 1 tablet (20 mEq total) by mouth daily for 14 days.   pravastatin 10 MG tablet Commonly known as: PRAVACHOL Take 10 mg by mouth daily.               Discharge Care Instructions  (From admission, onward)           Start     Ordered   12/31/22 0000  Weight bearing as tolerated        12/31/22 1419            Follow-up Information     Cristie Hem, PA-C Follow up in 2 week(s).   Specialty: Orthopedic Surgery Why: For suture removal, For wound re-check Contact information: [address] [place] [address] [phone]                Allergies  Allergen Reactions   Codeine     "Hypes" pt up.   Lisinopril Cough    Consultations: Orthopedics   Procedures/Studies: DG Pelvis Portable  Result Date: 12/31/2022 CLINICAL DATA:  Left hip arthroplasty EXAM: PORTABLE PELVIS 1-2 VIEWS COMPARISON:  12/29/2022 FINDINGS: Frontal view of the lower pelvis and bilateral hips was performed. Left hip arthroplasty is identified in the expected position without evidence of acute complication. Postsurgical changes in the overlying soft tissues. Visualized portions of the bony pelvis and right hip are unremarkable. IMPRESSION: 1. Unremarkable left hip arthroplasty. Electronically Signed   By:  Sharlet Salina M.D.   On: 12/31/2022 21:24   DG HIP UNILAT WITH PELVIS 1V LEFT  Result Date: 12/31/2022 CLINICAL DATA:  Total left hip arthroplasty. Intraoperative fluoroscopy. EXAM: DG HIP (WITH OR WITHOUT PELVIS) 1V*L* COMPARISON:  Pelvis and left hip radiographs 12/29/2022, CT left hip 12/29/2022 FINDINGS: Images were performed intraoperatively without the presence of a radiologist. Interval total left hip arthroplasty. No hardware complication is seen. Total fluoroscopy images: 2 Total fluoroscopy time: 23 seconds Total dose: Radiation Exposure Index (as provided by the fluoroscopic device): 2.38 mGy air Kerma Please see intraoperative findings for further detail. IMPRESSION: Intraoperative fluoroscopy provided for left hip arthroplasty. Electronically Signed   By: Neita Garnet M.D.   On: 12/31/2022 16:19   DG C-Arm 1-60 Min-No Report  Result Date: 12/31/2022 Fluoroscopy was utilized  by the requesting physician.  No radiographic interpretation.   DG C-Arm 1-60 Min-No Report  Result Date: 12/31/2022 Fluoroscopy was utilized by the requesting physician.  No radiographic interpretation.   CT Hip Left Wo Contrast  Result Date: 12/29/2022 CLINICAL DATA:  Suspected hip fracture EXAM: CT PELVIS WITHOUT CONTRAST CT hip without contrast TECHNIQUE: Multidetector CT imaging of the pelvis and left hip was performed following the standard protocol without intravenous contrast. RADIATION DOSE REDUCTION: This exam was performed according to the departmental dose-optimization program which includes automated exposure control, adjustment of the mA and/or kV according to patient size and/or use of iterative reconstruction technique. COMPARISON:  X-ray 12/29/2022 FINDINGS: Urinary Tract:  Preserved contours of the urinary bladder. Bowel: Sigmoid colon diverticula. Scattered stool. The visualized small and large bowel are nondilated. Vascular/Lymphatic: Minimal vascular calcifications along the left and right  common iliac arteries. No specific abnormal lymph node enlargement identified. Reproductive:  Uterus is absent.  No adnexal mass. Other: Motion seen throughout the examination. Anasarca. No free fluid in the pelvis. Musculoskeletal: Minimally angulated and displaced subcapital femoral neck fracture on the left. Small joint effusion. Underlying osteopenia. Degenerative changes also seen of the pelvis including both sacroiliac joints with sclerosis and joint space loss and small osteophytes. Hypertrophic changes along the pubic symphysis. There is also some concentric joint space loss seen of the hip joints. Degenerative changes seen of the lumbar spine at the edge of the imaging field as well. There is a spiculated sclerotic focus along the intertrochanteric region of the right hip consistent with a bone island. IMPRESSION: Minimally angulated and displaced subcapital left femoral neck fracture. Osteopenia with multifocal degenerative changes. Sigmoid colon diverticula. Motion artifact. Electronically Signed   By: Karen Kays M.D.   On: 12/29/2022 11:12   CT PELVIS WO CONTRAST  Result Date: 12/29/2022 CLINICAL DATA:  Suspected hip fracture EXAM: CT PELVIS WITHOUT CONTRAST CT hip without contrast TECHNIQUE: Multidetector CT imaging of the pelvis and left hip was performed following the standard protocol without intravenous contrast. RADIATION DOSE REDUCTION: This exam was performed according to the departmental dose-optimization program which includes automated exposure control, adjustment of the mA and/or kV according to patient size and/or use of iterative reconstruction technique. COMPARISON:  X-ray 12/29/2022 FINDINGS: Urinary Tract:  Preserved contours of the urinary bladder. Bowel: Sigmoid colon diverticula. Scattered stool. The visualized small and large bowel are nondilated. Vascular/Lymphatic: Minimal vascular calcifications along the left and right common iliac arteries. No specific abnormal lymph node  enlargement identified. Reproductive:  Uterus is absent.  No adnexal mass. Other: Motion seen throughout the examination. Anasarca. No free fluid in the pelvis. Musculoskeletal: Minimally angulated and displaced subcapital femoral neck fracture on the left. Small joint effusion. Underlying osteopenia. Degenerative changes also seen of the pelvis including both sacroiliac joints with sclerosis and joint space loss and small osteophytes. Hypertrophic changes along the pubic symphysis. There is also some concentric joint space loss seen of the hip joints. Degenerative changes seen of the lumbar spine at the edge of the imaging field as well. There is a spiculated sclerotic focus along the intertrochanteric region of the right hip consistent with a bone island. IMPRESSION: Minimally angulated and displaced subcapital left femoral neck fracture. Osteopenia with multifocal degenerative changes. Sigmoid colon diverticula. Motion artifact. Electronically Signed   By: Karen Kays M.D.   On: 12/29/2022 11:12   DG Lumbar Spine Complete  Result Date: 12/29/2022 CLINICAL DATA:  Trauma, fall, pain EXAM: LUMBAR SPINE - COMPLETE 4+ VIEW  COMPARISON:  None Available. FINDINGS: No recent fracture is seen. Degenerative changes are noted with disc space narrowing and bony spurs at multiple levels. There is minimal anterolisthesis at L4-L5 level. Dextroscoliosis is seen in lumbar spine. Surgical clips are noted in anterior upper and mid abdomen. Few surgical clips are seen in pelvis. IMPRESSION: No recent fracture is seen in the lumbar spine. Lumbar spondylosis, more so from L3-S1 levels. There is possible minimal anterolisthesis at the L4-L5 level. Dextroscoliosis. Electronically Signed   By: Ernie Avena M.D.   On: 12/29/2022 10:02   DG Hip Unilat With Pelvis 2-3 Views Left  Result Date: 12/29/2022 CLINICAL DATA:  Pain after fall EXAM: DG HIP (WITH OR WITHOUT PELVIS) 3V LEFT COMPARISON:  None Available. FINDINGS: There  is subtle linear lucency along the femoral neck on the left. Nondisplaced fracture is possible. Recommend confirmatory cross-sectional imaging study. Preserved joint spaces. No additional fracture or dislocation. Osteopenia. Spiculated focus along the intertrochanteric region of the right hip may be a bone island. IMPRESSION: There is subtle linear lucency along the femoral neck on the left with disturbed trabecula. Nondisplaced fracture is possible. Recommend confirmatory cross-sectional imaging study. Electronically Signed   By: Karen Kays M.D.   On: 12/29/2022 09:59   (Echo, Carotid, EGD, Colonoscopy, ERCP)    Subjective: Patient seen and examined.  Husband and daughter at the bedside.  Denies any issues.  Hurts on mobilizing but controlled with oral pain medication.   Discharge Exam: Vitals:   01/02/23 0551 01/02/23 0743  BP: 138/82 (!) 140/70  Pulse: 86 87  Resp: 18 16  Temp: 98.2 F (36.8 C) 98.8 F (37.1 C)  SpO2: 97% 95%   Vitals:   01/01/23 1626 01/01/23 2316 01/02/23 0551 01/02/23 0743  BP: 118/61 122/77 138/82 (!) 140/70  Pulse: 80 92 86 87  Resp: 18 18 18 16   Temp: 98.3 F (36.8 C) 98.9 F (37.2 C) 98.2 F (36.8 C) 98.8 F (37.1 C)  TempSrc:  Oral Oral Oral  SpO2: 97% 100% 97% 95%  Weight:      Height:        General: Pt is alert, awake, not in acute distress Cardiovascular: RRR, S1/S2 +, no rubs, no gallops Respiratory: CTA bilaterally, no wheezing, no rhonchi Abdominal: Soft, NT, ND, bowel sounds + Extremities: no edema, no cyanosis Left lateral thigh incision clean and dry.  Dressing intact.    The results of significant diagnostics from this hospitalization (including imaging, microbiology, ancillary and laboratory) are listed below for reference.     Microbiology: Recent Results (from the past 240 hour(s))  Surgical pcr screen     Status: None   Collection Time: 12/31/22  4:09 AM   Specimen: Nasal Mucosa; Nasal Swab  Result Value Ref Range Status    MRSA, PCR NEGATIVE NEGATIVE Final   Staphylococcus aureus NEGATIVE NEGATIVE Final    Comment: (NOTE) The Xpert SA Assay (FDA approved for NASAL specimens in patients 1 years of age and older), is one component of a comprehensive surveillance program. It is not intended to diagnose infection nor to guide or monitor treatment. Performed at [facility], [address], [place], [place] [address]      Labs: BNP (last 3 results) No results for input(s): "BNP" in the last 8760 hours. Basic Metabolic Panel: Recent Labs  Lab 12/29/22 0917 12/31/22 1743 01/01/23 0027 01/02/23 0208  NA 134*  --  132* 132*  K 3.4*  --  3.6 3.2*  CL  98  --  98 98  CO2 26  --  21* 24  GLUCOSE 156*  --  187* 142*  BUN 9  --  17 11  CREATININE 0.72 0.78 0.69 0.53  CALCIUM 9.4  --  8.0* 8.4*  MG 1.9  --   --   --   PHOS 3.3  --   --   --    Liver Function Tests: No results for input(s): "AST", "ALT", "ALKPHOS", "BILITOT", "PROT", "ALBUMIN" in the last 168 hours. No results for input(s): "LIPASE", "AMYLASE" in the last 168 hours. No results for input(s): "AMMONIA" in the last 168 hours. CBC: Recent Labs  Lab 12/29/22 0917 12/31/22 1743 01/01/23 0027 01/02/23 0208  WBC 8.6 12.3* 12.7* 12.1*  NEUTROABS 7.5  --   --   --   HGB 14.1 13.2 12.0 12.3  HCT 42.0 40.0 35.9* 36.3  MCV 87.7 88.5 87.1 87.3  PLT 237 223 213 205   Cardiac Enzymes: No results for input(s): "CKTOTAL", "CKMB", "CKMBINDEX", "TROPONINI" in the last 168 hours. BNP: Invalid input(s): "POCBNP" CBG: Recent Labs  Lab 01/01/23 1213 01/01/23 1626 01/01/23 2307 01/02/23 0812 01/02/23 1134  GLUCAP 164* 161* 132* 130* 157*   D-Dimer No results for input(s): "DDIMER" in the last 72 hours. Hgb A1c No results for input(s): "HGBA1C" in the last 72 hours. Lipid Profile No results for input(s): "CHOL", "HDL", "LDLCALC", "TRIG", "CHOLHDL", "LDLDIRECT" in the last 72 hours. Thyroid function studies No results for  input(s): "TSH", "T4TOTAL", "T3FREE", "THYROIDAB" in the last 72 hours.  Invalid input(s): "FREET3" Anemia work up No results for input(s): "VITAMINB12", "FOLATE", "FERRITIN", "TIBC", "IRON", "RETICCTPCT" in the last 72 hours. Urinalysis No results found for: "COLORURINE", "APPEARANCEUR", "LABSPEC", "PHURINE", "GLUCOSEU", "HGBUR", "BILIRUBINUR", "KETONESUR", "PROTEINUR", "UROBILINOGEN", "NITRITE", "LEUKOCYTESUR" Sepsis Labs Recent Labs  Lab 12/29/22 0917 12/31/22 1743 01/01/23 0027 01/02/23 0208  WBC 8.6 12.3* 12.7* 12.1*   Microbiology Recent Results (from the past 240 hour(s))  Surgical pcr screen     Status: None   Collection Time: 12/31/22  4:09 AM   Specimen: Nasal Mucosa; Nasal Swab  Result Value Ref Range Status   MRSA, PCR NEGATIVE NEGATIVE Final   Staphylococcus aureus NEGATIVE NEGATIVE Final    Comment: (NOTE) The Xpert SA Assay (FDA approved for NASAL specimens in patients 72 years of age and older), is one component of a comprehensive surveillance program. It is not intended to diagnose infection nor to guide or monitor treatment. Performed at [facility], [address] [place], [place] [address]      Time coordinating discharge: 32 minutes  SIGNED:   Dorcas Carrow, MD  [facility] 01/02/2023, 1:24 PM

## 2023-01-02 NOTE — [place] FL2 (Signed)
[place] MEDICAID FL2 LEVEL OF CARE FORM     IDENTIFICATION  Patient Name: Andrea Wong Birthdate: 1934/02/08 Sex: female Admission Date (Current Location): 12/29/2022  [facility] and [place] Number:  Producer, television/film/video and Address:  [facility] [place], [address], [place], [place] [address]      Provider Number: [id number]  Attending Physician Name and Address:  Dorcas Carrow, MD  Relative Name and Phone Number:  Celine Mans  [phone]    Current Level of Care: Hospital Recommended Level of Care: Skilled Nursing Facility Prior Approval Number:    Date Approved/Denied:   PASRR Number: pending  Discharge Plan: SNF    Current Diagnoses: Patient Active Problem List   Diagnosis Date Noted   Traumatic closed displaced fracture of neck of left femur, initial encounter (HCC) 12/29/2022   Essential hypertension 12/29/2022   Gastroesophageal reflux disease 12/29/2022   Hypercholesterolemia 12/29/2022   Osteopenia 12/29/2022   Type 2 diabetes mellitus (HCC) 12/29/2022   Lumbar radiculopathy 08/21/2020   Primary osteoarthritis of left knee 01/03/2020   Primary osteoarthritis of right knee 01/03/2020    Orientation RESPIRATION BLADDER Height & Weight     Self, Time, Situation, Place  Normal Continent Weight: 149 lb 14.6 oz (68 kg) Height:  5\' 4"  (162.6 cm)  BEHAVIORAL SYMPTOMS/MOOD NEUROLOGICAL BOWEL NUTRITION STATUS      Continent Diet (See DC summary)  AMBULATORY STATUS COMMUNICATION OF NEEDS Skin   Limited Assist Verbally Surgical wounds (L hip Incision)                       Personal Care Assistance Level of Assistance  Bathing, Feeding, Dressing Bathing Assistance: Limited assistance Feeding assistance: Independent Dressing Assistance: Limited assistance     Functional Limitations Info  Sight, Hearing, Speech Sight Info: Adequate Hearing Info: Adequate Speech Info: Adequate    SPECIAL CARE FACTORS FREQUENCY  PT (By  licensed PT), OT (By licensed OT)     PT Frequency: 5x week OT Frequency: 5x week            Contractures Contractures Info: Not present    Additional Factors Info  Code Status, Allergies, Insulin Sliding Scale Code Status Info: Full Allergies Info: Codeine  Lisinopril   Insulin Sliding Scale Info: See DC summary       Current Medications (01/02/2023):  This is the current hospital active medication list Current Facility-Administered Medications  Medication Dose Route Frequency Provider Last Rate Last Admin   0.9 %  sodium chloride infusion   Intravenous Continuous Tarry Kos, MD 75 mL/hr at 01/02/23 0947 Infusion Verify at 01/02/23 0947   acetaminophen (TYLENOL) tablet 650 mg  650 mg Oral Q6H PRN Tarry Kos, MD   650 mg at 12/30/22 2126   Or   acetaminophen (TYLENOL) suppository 650 mg  650 mg Rectal Q6H PRN Tarry Kos, MD       alum & mag hydroxide-simeth (MAALOX/MYLANTA) 200-200-20 MG/5ML suspension 30 mL  30 mL Oral Q4H PRN Tarry Kos, MD       amLODipine (NORVASC) tablet 5 mg  5 mg Oral Daily Tarry Kos, MD   5 mg at 01/02/23 0940   aspirin EC tablet 81 mg  81 mg Oral Daily Tarry Kos, MD   81 mg at 01/02/23 0941   calcium-vitamin D (OSCAL WITH D) 500-5 MG-MCG per tablet 1 tablet  1 tablet Oral QHS Tarry Kos, MD   1  tablet at 01/01/23 2148   docusate sodium (COLACE) capsule 100 mg  100 mg Oral BID Tarry Kos, MD   100 mg at 01/02/23 0940   enoxaparin (LOVENOX) injection 40 mg  40 mg Subcutaneous Q24H Tarry Kos, MD   40 mg at 01/01/23 1650   famotidine (PEPCID) tablet 20 mg  20 mg Oral Daily Tarry Kos, MD   20 mg at 01/02/23 0941   feeding supplement (ENSURE ENLIVE / ENSURE PLUS) liquid 237 mL  237 mL Oral BID BM Tarry Kos, MD   237 mL at 01/01/23 0904   hydrochlorothiazide (HYDRODIURIL) tablet 12.5 mg  12.5 mg Oral q morning Tarry Kos, MD   12.5 mg at 01/02/23 0940   HYDROmorphone (DILAUDID) injection 0.5 mg  0.5 mg Intravenous Q2H PRN  Tarry Kos, MD       HYDROmorphone (DILAUDID) injection 0.5-1 mg  0.5-1 mg Intravenous Q4H PRN Tarry Kos, MD   0.5 mg at 01/01/23 2316   insulin aspart (novoLOG) injection 0-9 Units  0-9 Units Subcutaneous TID WC Tarry Kos, MD   1 Units at 01/02/23 8295   lactated ringers infusion   Intravenous Continuous Heather Roberts, MD       lactated ringers infusion   Intravenous Continuous Heather Roberts, MD 10 mL/hr at 12/31/22 1423 Restarted at 12/31/22 1432   loratadine (CLARITIN) tablet 10 mg  10 mg Oral Daily PRN Tarry Kos, MD       losartan (COZAAR) tablet 50 mg  50 mg Oral Daily Tarry Kos, MD   50 mg at 01/02/23 0940   magnesium citrate solution 1 Bottle  1 Bottle Oral Once PRN Tarry Kos, MD       magnesium hydroxide (MILK OF MAGNESIA) suspension 30 mL  30 mL Oral Daily PRN Tarry Kos, MD       menthol-cetylpyridinium (CEPACOL) lozenge 3 mg  1 lozenge Oral PRN Tarry Kos, MD       Or   phenol (CHLORASEPTIC) mouth spray 1 spray  1 spray Mouth/Throat PRN Tarry Kos, MD       metFORMIN (GLUCOPHAGE) tablet 500 mg  500 mg Oral Q breakfast Tarry Kos, MD   500 mg at 01/02/23 0940   methocarbamol (ROBAXIN) tablet 500 mg  500 mg Oral Q6H PRN Tarry Kos, MD       Or   methocarbamol (ROBAXIN) 500 mg in dextrose 5 % 50 mL IVPB  500 mg Intravenous Q6H PRN Tarry Kos, MD       multivitamin with minerals tablet 1 tablet  1 tablet Oral Daily Tarry Kos, MD   1 tablet at 01/02/23 0940   ondansetron (ZOFRAN) tablet 4 mg  4 mg Oral Q6H PRN Tarry Kos, MD       Or   ondansetron [facility]) injection 4 mg  4 mg Intravenous Q6H PRN Tarry Kos, MD       oxyCODONE (Oxy IR/ROXICODONE) immediate release tablet 10-15 mg  10-15 mg Oral Q4H PRN Tarry Kos, MD       oxyCODONE (Oxy IR/ROXICODONE) immediate release tablet 5 mg  5 mg Oral Q6H PRN Tarry Kos, MD   5 mg at 12/31/22 2000   oxyCODONE (Oxy IR/ROXICODONE) immediate release tablet 5-10 mg  5-10 mg Oral Q4H PRN Tarry Kos, MD   5 mg at 01/01/23 1651   pantoprazole (PROTONIX) EC tablet 40 mg  40 mg Oral Daily Tarry Kos, MD   40 mg at 01/02/23 0940   polyethylene glycol (MIRALAX / GLYCOLAX) packet 17 g  17 g Oral Daily PRN Tarry Kos, MD       potassium chloride SA (KLOR-CON M) CR tablet 40 mEq  40 mEq Oral BID Dorcas Carrow, MD   40 mEq at 01/02/23 0940   pravastatin (PRAVACHOL) tablet 10 mg  10 mg Oral QHS Tarry Kos, MD   10 mg at 01/01/23 2148   senna-docusate (Senokot-S) tablet 1 tablet  1 tablet Oral BID Bobette Mo, MD   1 tablet at 01/02/23 0941   sorbitol 70 % solution 30 mL  30 mL Oral Daily PRN Tarry Kos, MD         Discharge Medications: Please see discharge summary for a list of discharge medications.  Relevant Imaging Results:  Relevant Lab Results:   Additional Information SS# [id number] [place]

## 2023-01-02 NOTE — TOC Initial Note (Signed)
Transition of Care [facility]) - Initial/Assessment Note    Patient Details  Name: Andrea Wong MRN: [id number] Date of Birth: 1933-11-24  [facility] CM/SW Contact:    Carley Hammed, LCSW Phone Number: 01/02/2023, 11:27 AM  Clinical Narrative:                 CSW met with pt and family at bedside to discuss SNF recs. Pt and family stating a preference for [facility] and facility stated they were on their radar. Pt is from home with spouse, dtr lives nearby and assists. Pt was independent prior and will return home when better. CSW to complete workup and contact [facility] to assess. TOC will continue to follow for DC needs.   Expected Discharge Plan: Skilled Nursing Facility Barriers to Discharge: Continued Medical Work up, English as a second language teacher, SNF Pending bed offer   Patient Goals and CMS Choice Patient states their goals for this hospitalization and ongoing recovery are:: To regain independence. CMS Medicare.gov Compare Post Acute Care list provided to:: Patient Choice offered to / list presented to : Patient [place] ownership interest in [facility].provided to:: Patient    Expected Discharge Plan and Services   Discharge Planning Services: CM Consult Post Acute Care Choice: Skilled Nursing Facility Living arrangements for the past 2 months: Single Family Home                 DME Arranged: N/A (await post op PT recommendations)         HH Arranged:  (await Post op PT recommendations)          Prior Living Arrangements/Services Living arrangements for the past 2 months: Single Family Home Lives with:: Spouse Patient language and need for interpreter reviewed:: Yes Do you feel safe going back to the place where you live?: Yes      Need for Family Participation in Patient Care: Yes (Comment) Care giver support system in place?: Yes (comment) Current home services: DME Criminal Activity/Legal Involvement Pertinent to Current  Situation/Hospitalization: No - Comment as needed  Activities of Daily Living      Permission Sought/Granted Permission sought to share information with : Family Supports Permission granted to share information with : Yes, Verbal Permission Granted  Share Information with NAME: husband and daughter           Emotional Assessment Appearance:: Appears stated age Attitude/Demeanor/Rapport: Engaged Affect (typically observed): Appropriate Orientation: : Oriented to Self, Oriented to Place, Oriented to  Time, Oriented to Situation Alcohol / Substance Use: Not Applicable Psych Involvement: No (comment)  Admission diagnosis:  Closed fracture of left hip, initial encounter (HCC) [S72.002A] Closed left hip fracture, initial encounter (HCC) [S72.002A] Patient Active Problem List   Diagnosis Date Noted   Traumatic closed displaced fracture of neck of left femur, initial encounter (HCC) 12/29/2022   Essential hypertension 12/29/2022   Gastroesophageal reflux disease 12/29/2022   Hypercholesterolemia 12/29/2022   Osteopenia 12/29/2022   Type 2 diabetes mellitus (HCC) 12/29/2022   Lumbar radiculopathy 08/21/2020   Primary osteoarthritis of left knee 01/03/2020   Primary osteoarthritis of right knee 01/03/2020   PCP:  Clovis Riley, L.August Saucer, MD Pharmacy:   [facility] [address] [place] - [address] [place] [address] Phone: [phone] Fax: [phone]     Social Determinants of Health (SDOH) Social History: SDOH Screenings   Tobacco Use: Low Risk  (01/01/2023)   SDOH Interventions:     Readmission Risk Interventions  No data to display

## 2023-01-07 DIAGNOSIS — E119 Type 2 diabetes mellitus without complications: Secondary | ICD-10-CM | POA: Diagnosis not present

## 2023-01-07 DIAGNOSIS — I10 Essential (primary) hypertension: Secondary | ICD-10-CM | POA: Diagnosis not present

## 2023-01-07 DIAGNOSIS — E569 Vitamin deficiency, unspecified: Secondary | ICD-10-CM | POA: Diagnosis not present

## 2023-01-07 DIAGNOSIS — E7800 Pure hypercholesterolemia, unspecified: Secondary | ICD-10-CM | POA: Diagnosis not present

## 2023-01-08 DIAGNOSIS — I10 Essential (primary) hypertension: Secondary | ICD-10-CM | POA: Diagnosis not present

## 2023-01-08 DIAGNOSIS — S72002A Fracture of unspecified part of neck of left femur, initial encounter for closed fracture: Secondary | ICD-10-CM | POA: Diagnosis not present

## 2023-01-08 DIAGNOSIS — E1169 Type 2 diabetes mellitus with other specified complication: Secondary | ICD-10-CM | POA: Diagnosis not present

## 2023-01-08 DIAGNOSIS — M8580 Other specified disorders of bone density and structure, unspecified site: Secondary | ICD-10-CM | POA: Diagnosis not present

## 2023-01-14 DIAGNOSIS — I10 Essential (primary) hypertension: Secondary | ICD-10-CM | POA: Diagnosis not present

## 2023-01-14 DIAGNOSIS — E569 Vitamin deficiency, unspecified: Secondary | ICD-10-CM | POA: Diagnosis not present

## 2023-01-14 DIAGNOSIS — E119 Type 2 diabetes mellitus without complications: Secondary | ICD-10-CM | POA: Diagnosis not present

## 2023-01-14 DIAGNOSIS — E7800 Pure hypercholesterolemia, unspecified: Secondary | ICD-10-CM | POA: Diagnosis not present

## 2023-01-22 ENCOUNTER — Telehealth: Payer: Self-pay | Admitting: Orthopaedic Surgery

## 2023-01-22 NOTE — Telephone Encounter (Signed)
Yes she is at a SNF so the orders will need to come from them.

## 2023-01-22 NOTE — Telephone Encounter (Signed)
Called and gave verbal to continue. SNF ordered initial visit, but they needed the okay to continue.

## 2023-01-22 NOTE — Telephone Encounter (Signed)
Calvin form [facility] called asking if Dr Roda Shutters will be ordering home health for OT, PT, and skilled nursing for pt. Pt was discharged for nursing facility. Please call Calvin on secure line at [phone].

## 2023-01-24 DIAGNOSIS — S72012D Unspecified intracapsular fracture of left femur, subsequent encounter for closed fracture with routine healing: Secondary | ICD-10-CM | POA: Diagnosis not present

## 2023-01-24 DIAGNOSIS — I10 Essential (primary) hypertension: Secondary | ICD-10-CM | POA: Diagnosis not present

## 2023-01-24 DIAGNOSIS — E785 Hyperlipidemia, unspecified: Secondary | ICD-10-CM | POA: Diagnosis not present

## 2023-01-24 DIAGNOSIS — Z79891 Long term (current) use of opiate analgesic: Secondary | ICD-10-CM | POA: Diagnosis not present

## 2023-01-24 DIAGNOSIS — E119 Type 2 diabetes mellitus without complications: Secondary | ICD-10-CM | POA: Diagnosis not present

## 2023-01-24 DIAGNOSIS — M8580 Other specified disorders of bone density and structure, unspecified site: Secondary | ICD-10-CM | POA: Diagnosis not present

## 2023-01-24 DIAGNOSIS — Z96642 Presence of left artificial hip joint: Secondary | ICD-10-CM | POA: Diagnosis not present

## 2023-01-24 DIAGNOSIS — K219 Gastro-esophageal reflux disease without esophagitis: Secondary | ICD-10-CM | POA: Diagnosis not present

## 2023-01-24 DIAGNOSIS — Z7984 Long term (current) use of oral hypoglycemic drugs: Secondary | ICD-10-CM | POA: Diagnosis not present

## 2023-01-26 DIAGNOSIS — S72012D Unspecified intracapsular fracture of left femur, subsequent encounter for closed fracture with routine healing: Secondary | ICD-10-CM | POA: Diagnosis not present

## 2023-01-26 DIAGNOSIS — M8580 Other specified disorders of bone density and structure, unspecified site: Secondary | ICD-10-CM | POA: Diagnosis not present

## 2023-01-26 DIAGNOSIS — Z7984 Long term (current) use of oral hypoglycemic drugs: Secondary | ICD-10-CM | POA: Diagnosis not present

## 2023-01-26 DIAGNOSIS — I10 Essential (primary) hypertension: Secondary | ICD-10-CM | POA: Diagnosis not present

## 2023-01-26 DIAGNOSIS — Z96642 Presence of left artificial hip joint: Secondary | ICD-10-CM | POA: Diagnosis not present

## 2023-01-26 DIAGNOSIS — Z79891 Long term (current) use of opiate analgesic: Secondary | ICD-10-CM | POA: Diagnosis not present

## 2023-01-26 DIAGNOSIS — E119 Type 2 diabetes mellitus without complications: Secondary | ICD-10-CM | POA: Diagnosis not present

## 2023-01-26 DIAGNOSIS — E785 Hyperlipidemia, unspecified: Secondary | ICD-10-CM | POA: Diagnosis not present

## 2023-01-26 DIAGNOSIS — K219 Gastro-esophageal reflux disease without esophagitis: Secondary | ICD-10-CM | POA: Diagnosis not present

## 2023-01-27 ENCOUNTER — Telehealth: Payer: Self-pay

## 2023-01-27 ENCOUNTER — Other Ambulatory Visit (INDEPENDENT_AMBULATORY_CARE_PROVIDER_SITE_OTHER): Payer: Medicare PPO

## 2023-01-27 ENCOUNTER — Ambulatory Visit (INDEPENDENT_AMBULATORY_CARE_PROVIDER_SITE_OTHER): Payer: Medicare PPO | Admitting: Physician Assistant

## 2023-01-27 DIAGNOSIS — S72002A Fracture of unspecified part of neck of left femur, initial encounter for closed fracture: Secondary | ICD-10-CM

## 2023-01-27 NOTE — Telephone Encounter (Signed)
IC therapist with [facility] gave verbal for HHPT 2x/wk for 4 wks

## 2023-01-27 NOTE — Progress Notes (Signed)
   Post-Op Visit Note   Patient: Andrea Wong           Date of Birth: [birth year]           MRN: [id number] Visit Date: 01/27/2023 PCP: Eloisa Northern, MD   Assessment & Plan:  Chief Complaint:  Chief Complaint  Patient presents with   Left Hip - Routine Post Op   Visit Diagnoses:  1. Traumatic closed displaced fracture of neck of left femur, initial encounter [facility])     Plan: Patient is a pleasant 89 year old female who comes in today approximately 4 weeks status post left hip replacement from a femoral neck fracture, date of surgery 12/31/2022.  She has been doing well.  She has been at home for the past few weeks getting him out physical therapy.  She is currently ambulating with a walker.  She is taking oxycodone for pain which is helping with the pain but making her somewhat sedated.  She has finished her Lovenox.  Examination of her left hip reveals a well healed surgical incision with nylon sutures in place.  No evidence of infection or cellulitis.  Calves are soft nontender.  She is neurovascularly intact distally.  Today, sutures were removed and Steri-Strips applied.  We will extend her home health physical therapy for another 4 weeks.  She will follow-up in 4 weeks for repeat evaluation and AP pelvis x-rays.  Call with concerns or questions in the meantime.  Follow-Up Instructions: Return in about 4 weeks (around 02/24/2023).   Orders:  Orders Placed This Encounter  Procedures   XR HIP UNILAT W OR W/O PELVIS 2-3 VIEWS LEFT   No orders of the defined types were placed in this encounter.   Imaging: XR HIP UNILAT W OR W/O PELVIS 2-3 VIEWS LEFT  Result Date: 01/27/2023 Well-seated prosthesis without complication   PMFS History: Patient Active Problem List   Diagnosis Date Noted   Traumatic closed displaced fracture of neck of left femur, initial encounter (HCC) 12/29/2022   Essential hypertension 12/29/2022   Gastroesophageal reflux disease 12/29/2022    Hypercholesterolemia 12/29/2022   Osteopenia 12/29/2022   Type 2 diabetes mellitus (HCC) 12/29/2022   Lumbar radiculopathy 08/21/2020   Primary osteoarthritis of left knee 01/03/2020   Primary osteoarthritis of right knee 01/03/2020   Past Medical History:  Diagnosis Date   Allergy    seasonal    Cancer (HCC)    breast CA (1970), Uterine CA (2003)   Cataract    B cataracts extracted 2019   Diabetes mellitus without complication (HCC)    GERD (gastroesophageal reflux disease)    Hiatal hernia    Hyperlipidemia    Hypertension    Osteopenia     No family history on file.  Past Surgical History:  Procedure Laterality Date   BREAST BIOPSY     BREAST LUMPECTOMY Left    1970   TOTAL HIP ARTHROPLASTY Left 12/31/2022   Procedure: TOTAL HIP ARTHROPLASTY ANTERIOR APPROACH;  Surgeon: Tarry Kos, MD;  Location: [facility] OR;  Service: Orthopedics;  Laterality: Left;   Social History   Occupational History   Not on file  Tobacco Use   Smoking status: Never   Smokeless tobacco: Never  Substance and Sexual Activity   Alcohol use: Yes    Alcohol/week: 2.0 standard drinks of alcohol    Types: 2 Glasses of wine per week   Drug use: Never   Sexual activity: Not on file

## 2023-01-28 DIAGNOSIS — M8580 Other specified disorders of bone density and structure, unspecified site: Secondary | ICD-10-CM | POA: Diagnosis not present

## 2023-01-28 DIAGNOSIS — S72012D Unspecified intracapsular fracture of left femur, subsequent encounter for closed fracture with routine healing: Secondary | ICD-10-CM | POA: Diagnosis not present

## 2023-01-28 DIAGNOSIS — E119 Type 2 diabetes mellitus without complications: Secondary | ICD-10-CM | POA: Diagnosis not present

## 2023-01-28 DIAGNOSIS — Z79891 Long term (current) use of opiate analgesic: Secondary | ICD-10-CM | POA: Diagnosis not present

## 2023-01-28 DIAGNOSIS — E785 Hyperlipidemia, unspecified: Secondary | ICD-10-CM | POA: Diagnosis not present

## 2023-01-28 DIAGNOSIS — Z96642 Presence of left artificial hip joint: Secondary | ICD-10-CM | POA: Diagnosis not present

## 2023-01-28 DIAGNOSIS — I10 Essential (primary) hypertension: Secondary | ICD-10-CM | POA: Diagnosis not present

## 2023-01-28 DIAGNOSIS — K219 Gastro-esophageal reflux disease without esophagitis: Secondary | ICD-10-CM | POA: Diagnosis not present

## 2023-01-28 DIAGNOSIS — Z7984 Long term (current) use of oral hypoglycemic drugs: Secondary | ICD-10-CM | POA: Diagnosis not present

## 2023-01-30 DIAGNOSIS — Z79891 Long term (current) use of opiate analgesic: Secondary | ICD-10-CM | POA: Diagnosis not present

## 2023-01-30 DIAGNOSIS — M8580 Other specified disorders of bone density and structure, unspecified site: Secondary | ICD-10-CM | POA: Diagnosis not present

## 2023-01-30 DIAGNOSIS — Z96642 Presence of left artificial hip joint: Secondary | ICD-10-CM | POA: Diagnosis not present

## 2023-01-30 DIAGNOSIS — K219 Gastro-esophageal reflux disease without esophagitis: Secondary | ICD-10-CM | POA: Diagnosis not present

## 2023-01-30 DIAGNOSIS — Z7984 Long term (current) use of oral hypoglycemic drugs: Secondary | ICD-10-CM | POA: Diagnosis not present

## 2023-01-30 DIAGNOSIS — S72012D Unspecified intracapsular fracture of left femur, subsequent encounter for closed fracture with routine healing: Secondary | ICD-10-CM | POA: Diagnosis not present

## 2023-01-30 DIAGNOSIS — I10 Essential (primary) hypertension: Secondary | ICD-10-CM | POA: Diagnosis not present

## 2023-01-30 DIAGNOSIS — E119 Type 2 diabetes mellitus without complications: Secondary | ICD-10-CM | POA: Diagnosis not present

## 2023-01-30 DIAGNOSIS — E785 Hyperlipidemia, unspecified: Secondary | ICD-10-CM | POA: Diagnosis not present

## 2023-01-30 NOTE — Telephone Encounter (Signed)
Andrea Wong from [facility] called. Says she will start patient's OT next week. Her cb# [phone]

## 2023-02-02 DIAGNOSIS — E119 Type 2 diabetes mellitus without complications: Secondary | ICD-10-CM | POA: Diagnosis not present

## 2023-02-02 DIAGNOSIS — I10 Essential (primary) hypertension: Secondary | ICD-10-CM | POA: Diagnosis not present

## 2023-02-02 DIAGNOSIS — Z79891 Long term (current) use of opiate analgesic: Secondary | ICD-10-CM | POA: Diagnosis not present

## 2023-02-02 DIAGNOSIS — K219 Gastro-esophageal reflux disease without esophagitis: Secondary | ICD-10-CM | POA: Diagnosis not present

## 2023-02-02 DIAGNOSIS — S72012D Unspecified intracapsular fracture of left femur, subsequent encounter for closed fracture with routine healing: Secondary | ICD-10-CM | POA: Diagnosis not present

## 2023-02-02 DIAGNOSIS — E785 Hyperlipidemia, unspecified: Secondary | ICD-10-CM | POA: Diagnosis not present

## 2023-02-02 DIAGNOSIS — Z96642 Presence of left artificial hip joint: Secondary | ICD-10-CM | POA: Diagnosis not present

## 2023-02-02 DIAGNOSIS — Z7984 Long term (current) use of oral hypoglycemic drugs: Secondary | ICD-10-CM | POA: Diagnosis not present

## 2023-02-02 DIAGNOSIS — M8580 Other specified disorders of bone density and structure, unspecified site: Secondary | ICD-10-CM | POA: Diagnosis not present

## 2023-02-03 ENCOUNTER — Telehealth: Payer: Self-pay | Admitting: Orthopaedic Surgery

## 2023-02-03 DIAGNOSIS — S72012D Unspecified intracapsular fracture of left femur, subsequent encounter for closed fracture with routine healing: Secondary | ICD-10-CM | POA: Diagnosis not present

## 2023-02-03 DIAGNOSIS — E785 Hyperlipidemia, unspecified: Secondary | ICD-10-CM | POA: Diagnosis not present

## 2023-02-03 DIAGNOSIS — I10 Essential (primary) hypertension: Secondary | ICD-10-CM | POA: Diagnosis not present

## 2023-02-03 DIAGNOSIS — Z79891 Long term (current) use of opiate analgesic: Secondary | ICD-10-CM | POA: Diagnosis not present

## 2023-02-03 DIAGNOSIS — E119 Type 2 diabetes mellitus without complications: Secondary | ICD-10-CM | POA: Diagnosis not present

## 2023-02-03 DIAGNOSIS — K219 Gastro-esophageal reflux disease without esophagitis: Secondary | ICD-10-CM | POA: Diagnosis not present

## 2023-02-03 DIAGNOSIS — Z96642 Presence of left artificial hip joint: Secondary | ICD-10-CM | POA: Diagnosis not present

## 2023-02-03 DIAGNOSIS — Z7984 Long term (current) use of oral hypoglycemic drugs: Secondary | ICD-10-CM | POA: Diagnosis not present

## 2023-02-03 DIAGNOSIS — M8580 Other specified disorders of bone density and structure, unspecified site: Secondary | ICD-10-CM | POA: Diagnosis not present

## 2023-02-03 NOTE — Telephone Encounter (Signed)
Andrea Wong (OT) from [facility] called requesting verbal orders for 1 wk 6. Andrea Wong secure number is [phone].

## 2023-02-03 NOTE — Telephone Encounter (Signed)
Called and gave verbal okay.  

## 2023-02-04 ENCOUNTER — Telehealth: Payer: Self-pay | Admitting: Orthopaedic Surgery

## 2023-02-04 ENCOUNTER — Other Ambulatory Visit: Payer: Self-pay | Admitting: Physician Assistant

## 2023-02-04 MED ORDER — HYDROCODONE-ACETAMINOPHEN 5-325 MG PO TABS: 1.0000 | ORAL_TABLET | Freq: Two times a day (BID) | ORAL | 0 refills | Status: AC | PRN

## 2023-02-04 NOTE — Telephone Encounter (Signed)
Husband called stating wife is in excruciating pain. Has only been taking 1 norco at a time; suggested based on how it was written to up it to 2 pills at a time. He said she only has 2-3 left and requesting a refill.

## 2023-02-04 NOTE — Telephone Encounter (Signed)
refilled 

## 2023-02-05 DIAGNOSIS — Z7984 Long term (current) use of oral hypoglycemic drugs: Secondary | ICD-10-CM | POA: Diagnosis not present

## 2023-02-05 DIAGNOSIS — E785 Hyperlipidemia, unspecified: Secondary | ICD-10-CM | POA: Diagnosis not present

## 2023-02-05 DIAGNOSIS — K219 Gastro-esophageal reflux disease without esophagitis: Secondary | ICD-10-CM | POA: Diagnosis not present

## 2023-02-05 DIAGNOSIS — Z96642 Presence of left artificial hip joint: Secondary | ICD-10-CM | POA: Diagnosis not present

## 2023-02-05 DIAGNOSIS — Z79891 Long term (current) use of opiate analgesic: Secondary | ICD-10-CM | POA: Diagnosis not present

## 2023-02-05 DIAGNOSIS — M8580 Other specified disorders of bone density and structure, unspecified site: Secondary | ICD-10-CM | POA: Diagnosis not present

## 2023-02-05 DIAGNOSIS — S72012D Unspecified intracapsular fracture of left femur, subsequent encounter for closed fracture with routine healing: Secondary | ICD-10-CM | POA: Diagnosis not present

## 2023-02-05 DIAGNOSIS — I10 Essential (primary) hypertension: Secondary | ICD-10-CM | POA: Diagnosis not present

## 2023-02-05 DIAGNOSIS — E119 Type 2 diabetes mellitus without complications: Secondary | ICD-10-CM | POA: Diagnosis not present

## 2023-02-05 NOTE — Telephone Encounter (Signed)
Notified via voicemail

## 2023-02-06 ENCOUNTER — Telehealth: Payer: Self-pay

## 2023-02-06 DIAGNOSIS — E785 Hyperlipidemia, unspecified: Secondary | ICD-10-CM | POA: Diagnosis not present

## 2023-02-06 DIAGNOSIS — I10 Essential (primary) hypertension: Secondary | ICD-10-CM | POA: Diagnosis not present

## 2023-02-06 DIAGNOSIS — Z96642 Presence of left artificial hip joint: Secondary | ICD-10-CM | POA: Diagnosis not present

## 2023-02-06 DIAGNOSIS — M8580 Other specified disorders of bone density and structure, unspecified site: Secondary | ICD-10-CM | POA: Diagnosis not present

## 2023-02-06 DIAGNOSIS — K219 Gastro-esophageal reflux disease without esophagitis: Secondary | ICD-10-CM | POA: Diagnosis not present

## 2023-02-06 DIAGNOSIS — Z7984 Long term (current) use of oral hypoglycemic drugs: Secondary | ICD-10-CM | POA: Diagnosis not present

## 2023-02-06 DIAGNOSIS — Z79891 Long term (current) use of opiate analgesic: Secondary | ICD-10-CM | POA: Diagnosis not present

## 2023-02-06 DIAGNOSIS — S72012D Unspecified intracapsular fracture of left femur, subsequent encounter for closed fracture with routine healing: Secondary | ICD-10-CM | POA: Diagnosis not present

## 2023-02-06 DIAGNOSIS — E119 Type 2 diabetes mellitus without complications: Secondary | ICD-10-CM | POA: Diagnosis not present

## 2023-02-06 NOTE — Telephone Encounter (Signed)
Message left on the triage phone - This pt is s/p a total hipwith Dr. Roda Shutters. Pt's husband Greggory Stallion calling asking if it would be ok for the pt to use  " a pain patch with her oxycodone" state PT came out to the home today and made the suggestion and would like a call back as the pt is having increased discomfort. [phone]

## 2023-02-06 NOTE — Telephone Encounter (Signed)
Spoke with patient. He wants to use an OTC pain patch. Spoke with Mardella Layman and advised that was fine, but not to be placed over the incision area.

## 2023-02-10 DIAGNOSIS — M8580 Other specified disorders of bone density and structure, unspecified site: Secondary | ICD-10-CM | POA: Diagnosis not present

## 2023-02-10 DIAGNOSIS — Z96642 Presence of left artificial hip joint: Secondary | ICD-10-CM | POA: Diagnosis not present

## 2023-02-10 DIAGNOSIS — I10 Essential (primary) hypertension: Secondary | ICD-10-CM | POA: Diagnosis not present

## 2023-02-10 DIAGNOSIS — S72012D Unspecified intracapsular fracture of left femur, subsequent encounter for closed fracture with routine healing: Secondary | ICD-10-CM | POA: Diagnosis not present

## 2023-02-10 DIAGNOSIS — Z79891 Long term (current) use of opiate analgesic: Secondary | ICD-10-CM | POA: Diagnosis not present

## 2023-02-10 DIAGNOSIS — E785 Hyperlipidemia, unspecified: Secondary | ICD-10-CM | POA: Diagnosis not present

## 2023-02-10 DIAGNOSIS — Z7984 Long term (current) use of oral hypoglycemic drugs: Secondary | ICD-10-CM | POA: Diagnosis not present

## 2023-02-10 DIAGNOSIS — E119 Type 2 diabetes mellitus without complications: Secondary | ICD-10-CM | POA: Diagnosis not present

## 2023-02-10 DIAGNOSIS — K219 Gastro-esophageal reflux disease without esophagitis: Secondary | ICD-10-CM | POA: Diagnosis not present

## 2023-02-11 DIAGNOSIS — Z7984 Long term (current) use of oral hypoglycemic drugs: Secondary | ICD-10-CM | POA: Diagnosis not present

## 2023-02-11 DIAGNOSIS — M8580 Other specified disorders of bone density and structure, unspecified site: Secondary | ICD-10-CM | POA: Diagnosis not present

## 2023-02-11 DIAGNOSIS — I10 Essential (primary) hypertension: Secondary | ICD-10-CM | POA: Diagnosis not present

## 2023-02-11 DIAGNOSIS — K219 Gastro-esophageal reflux disease without esophagitis: Secondary | ICD-10-CM | POA: Diagnosis not present

## 2023-02-11 DIAGNOSIS — S72012D Unspecified intracapsular fracture of left femur, subsequent encounter for closed fracture with routine healing: Secondary | ICD-10-CM | POA: Diagnosis not present

## 2023-02-11 DIAGNOSIS — Z96642 Presence of left artificial hip joint: Secondary | ICD-10-CM | POA: Diagnosis not present

## 2023-02-11 DIAGNOSIS — E785 Hyperlipidemia, unspecified: Secondary | ICD-10-CM | POA: Diagnosis not present

## 2023-02-11 DIAGNOSIS — E119 Type 2 diabetes mellitus without complications: Secondary | ICD-10-CM | POA: Diagnosis not present

## 2023-02-11 DIAGNOSIS — Z79891 Long term (current) use of opiate analgesic: Secondary | ICD-10-CM | POA: Diagnosis not present

## 2023-02-12 DIAGNOSIS — M8580 Other specified disorders of bone density and structure, unspecified site: Secondary | ICD-10-CM | POA: Diagnosis not present

## 2023-02-12 DIAGNOSIS — Z96642 Presence of left artificial hip joint: Secondary | ICD-10-CM | POA: Diagnosis not present

## 2023-02-12 DIAGNOSIS — Z7984 Long term (current) use of oral hypoglycemic drugs: Secondary | ICD-10-CM | POA: Diagnosis not present

## 2023-02-12 DIAGNOSIS — Z79891 Long term (current) use of opiate analgesic: Secondary | ICD-10-CM | POA: Diagnosis not present

## 2023-02-12 DIAGNOSIS — S72012D Unspecified intracapsular fracture of left femur, subsequent encounter for closed fracture with routine healing: Secondary | ICD-10-CM | POA: Diagnosis not present

## 2023-02-12 DIAGNOSIS — E119 Type 2 diabetes mellitus without complications: Secondary | ICD-10-CM | POA: Diagnosis not present

## 2023-02-12 DIAGNOSIS — I10 Essential (primary) hypertension: Secondary | ICD-10-CM | POA: Diagnosis not present

## 2023-02-12 DIAGNOSIS — K219 Gastro-esophageal reflux disease without esophagitis: Secondary | ICD-10-CM | POA: Diagnosis not present

## 2023-02-12 DIAGNOSIS — E785 Hyperlipidemia, unspecified: Secondary | ICD-10-CM | POA: Diagnosis not present

## 2023-02-13 DIAGNOSIS — E119 Type 2 diabetes mellitus without complications: Secondary | ICD-10-CM | POA: Diagnosis not present

## 2023-02-13 DIAGNOSIS — K219 Gastro-esophageal reflux disease without esophagitis: Secondary | ICD-10-CM | POA: Diagnosis not present

## 2023-02-13 DIAGNOSIS — Z79891 Long term (current) use of opiate analgesic: Secondary | ICD-10-CM | POA: Diagnosis not present

## 2023-02-13 DIAGNOSIS — M8580 Other specified disorders of bone density and structure, unspecified site: Secondary | ICD-10-CM | POA: Diagnosis not present

## 2023-02-13 DIAGNOSIS — I10 Essential (primary) hypertension: Secondary | ICD-10-CM | POA: Diagnosis not present

## 2023-02-13 DIAGNOSIS — E785 Hyperlipidemia, unspecified: Secondary | ICD-10-CM | POA: Diagnosis not present

## 2023-02-13 DIAGNOSIS — S72012D Unspecified intracapsular fracture of left femur, subsequent encounter for closed fracture with routine healing: Secondary | ICD-10-CM | POA: Diagnosis not present

## 2023-02-13 DIAGNOSIS — Z96642 Presence of left artificial hip joint: Secondary | ICD-10-CM | POA: Diagnosis not present

## 2023-02-13 DIAGNOSIS — Z7984 Long term (current) use of oral hypoglycemic drugs: Secondary | ICD-10-CM | POA: Diagnosis not present

## 2023-02-16 DIAGNOSIS — I499 Cardiac arrhythmia, unspecified: Secondary | ICD-10-CM | POA: Diagnosis not present

## 2023-02-17 DIAGNOSIS — I10 Essential (primary) hypertension: Secondary | ICD-10-CM | POA: Diagnosis not present

## 2023-02-17 DIAGNOSIS — Z79891 Long term (current) use of opiate analgesic: Secondary | ICD-10-CM | POA: Diagnosis not present

## 2023-02-17 DIAGNOSIS — S72012D Unspecified intracapsular fracture of left femur, subsequent encounter for closed fracture with routine healing: Secondary | ICD-10-CM | POA: Diagnosis not present

## 2023-02-17 DIAGNOSIS — K219 Gastro-esophageal reflux disease without esophagitis: Secondary | ICD-10-CM | POA: Diagnosis not present

## 2023-02-17 DIAGNOSIS — Z96642 Presence of left artificial hip joint: Secondary | ICD-10-CM | POA: Diagnosis not present

## 2023-02-17 DIAGNOSIS — E785 Hyperlipidemia, unspecified: Secondary | ICD-10-CM | POA: Diagnosis not present

## 2023-02-17 DIAGNOSIS — M8580 Other specified disorders of bone density and structure, unspecified site: Secondary | ICD-10-CM | POA: Diagnosis not present

## 2023-02-17 DIAGNOSIS — E119 Type 2 diabetes mellitus without complications: Secondary | ICD-10-CM | POA: Diagnosis not present

## 2023-02-17 DIAGNOSIS — Z7984 Long term (current) use of oral hypoglycemic drugs: Secondary | ICD-10-CM | POA: Diagnosis not present

## 2023-02-18 DIAGNOSIS — Z7984 Long term (current) use of oral hypoglycemic drugs: Secondary | ICD-10-CM | POA: Diagnosis not present

## 2023-02-18 DIAGNOSIS — S72012D Unspecified intracapsular fracture of left femur, subsequent encounter for closed fracture with routine healing: Secondary | ICD-10-CM | POA: Diagnosis not present

## 2023-02-18 DIAGNOSIS — I10 Essential (primary) hypertension: Secondary | ICD-10-CM | POA: Diagnosis not present

## 2023-02-18 DIAGNOSIS — E119 Type 2 diabetes mellitus without complications: Secondary | ICD-10-CM | POA: Diagnosis not present

## 2023-02-18 DIAGNOSIS — E785 Hyperlipidemia, unspecified: Secondary | ICD-10-CM | POA: Diagnosis not present

## 2023-02-18 DIAGNOSIS — K219 Gastro-esophageal reflux disease without esophagitis: Secondary | ICD-10-CM | POA: Diagnosis not present

## 2023-02-18 DIAGNOSIS — M8580 Other specified disorders of bone density and structure, unspecified site: Secondary | ICD-10-CM | POA: Diagnosis not present

## 2023-02-18 DIAGNOSIS — Z79891 Long term (current) use of opiate analgesic: Secondary | ICD-10-CM | POA: Diagnosis not present

## 2023-02-18 DIAGNOSIS — Z96642 Presence of left artificial hip joint: Secondary | ICD-10-CM | POA: Diagnosis not present

## 2023-02-19 DIAGNOSIS — K219 Gastro-esophageal reflux disease without esophagitis: Secondary | ICD-10-CM | POA: Diagnosis not present

## 2023-02-19 DIAGNOSIS — Z96642 Presence of left artificial hip joint: Secondary | ICD-10-CM | POA: Diagnosis not present

## 2023-02-19 DIAGNOSIS — Z79891 Long term (current) use of opiate analgesic: Secondary | ICD-10-CM | POA: Diagnosis not present

## 2023-02-19 DIAGNOSIS — E119 Type 2 diabetes mellitus without complications: Secondary | ICD-10-CM | POA: Diagnosis not present

## 2023-02-19 DIAGNOSIS — Z7984 Long term (current) use of oral hypoglycemic drugs: Secondary | ICD-10-CM | POA: Diagnosis not present

## 2023-02-19 DIAGNOSIS — S72012D Unspecified intracapsular fracture of left femur, subsequent encounter for closed fracture with routine healing: Secondary | ICD-10-CM | POA: Diagnosis not present

## 2023-02-19 DIAGNOSIS — M8580 Other specified disorders of bone density and structure, unspecified site: Secondary | ICD-10-CM | POA: Diagnosis not present

## 2023-02-19 DIAGNOSIS — I10 Essential (primary) hypertension: Secondary | ICD-10-CM | POA: Diagnosis not present

## 2023-02-19 DIAGNOSIS — E785 Hyperlipidemia, unspecified: Secondary | ICD-10-CM | POA: Diagnosis not present

## 2023-02-20 DIAGNOSIS — K219 Gastro-esophageal reflux disease without esophagitis: Secondary | ICD-10-CM | POA: Diagnosis not present

## 2023-02-20 DIAGNOSIS — S72012D Unspecified intracapsular fracture of left femur, subsequent encounter for closed fracture with routine healing: Secondary | ICD-10-CM | POA: Diagnosis not present

## 2023-02-20 DIAGNOSIS — M8580 Other specified disorders of bone density and structure, unspecified site: Secondary | ICD-10-CM | POA: Diagnosis not present

## 2023-02-20 DIAGNOSIS — Z79891 Long term (current) use of opiate analgesic: Secondary | ICD-10-CM | POA: Diagnosis not present

## 2023-02-20 DIAGNOSIS — Z7984 Long term (current) use of oral hypoglycemic drugs: Secondary | ICD-10-CM | POA: Diagnosis not present

## 2023-02-20 DIAGNOSIS — Z96642 Presence of left artificial hip joint: Secondary | ICD-10-CM | POA: Diagnosis not present

## 2023-02-20 DIAGNOSIS — E785 Hyperlipidemia, unspecified: Secondary | ICD-10-CM | POA: Diagnosis not present

## 2023-02-20 DIAGNOSIS — I10 Essential (primary) hypertension: Secondary | ICD-10-CM | POA: Diagnosis not present

## 2023-02-20 DIAGNOSIS — E119 Type 2 diabetes mellitus without complications: Secondary | ICD-10-CM | POA: Diagnosis not present

## 2023-02-24 ENCOUNTER — Ambulatory Visit (INDEPENDENT_AMBULATORY_CARE_PROVIDER_SITE_OTHER): Payer: Medicare PPO | Admitting: Physician Assistant

## 2023-02-24 ENCOUNTER — Other Ambulatory Visit (INDEPENDENT_AMBULATORY_CARE_PROVIDER_SITE_OTHER): Payer: Medicare PPO

## 2023-02-24 ENCOUNTER — Encounter: Payer: Self-pay | Admitting: Physician Assistant

## 2023-02-24 DIAGNOSIS — Z96642 Presence of left artificial hip joint: Secondary | ICD-10-CM

## 2023-02-24 NOTE — Progress Notes (Signed)
   Post-Op Visit Note   Patient: Andrea Wong           Date of Birth: Feb 07, 1934           MRN: [id number] Visit Date: 02/24/2023 PCP: Eloisa Northern, MD   Assessment & Plan:  Chief Complaint:  Chief Complaint  Patient presents with   Right Hip - Follow-up    Right total hip arthroplasty 12/31/2022   Visit Diagnoses:  1. History of total left hip replacement     Plan: Patient is a pleasant 89 year old female who comes in today 8 weeks status post left total hip replacement from a femoral neck fracture, date of surgery 12/31/2022.  She has been doing okay.  She notes occasional discomfort to the left lateral hip which radiates to the knee.  She takes an occasional Tylenol for this.  She is getting home health physical therapy and is ambulating with either a cane or walker.  Examination left hip reveals painless hip flexion and logroll.  She is neurovascularly intact distally.  At this point, I recommended that she continue with physical therapy but she is not interested.  She will follow-up with [place] in 4 weeks for repeat evaluation and AP pelvis x-rays.  Will plan to release her at that point.  Call with concerns or questions.  Follow-Up Instructions: Return in about 4 weeks (around 03/24/2023).   Orders:  Orders Placed This Encounter  Procedures   XR Pelvis 1-2 Views   No orders of the defined types were placed in this encounter.   Imaging: XR Pelvis 1-2 Views  Result Date: 02/24/2023 Well-seated prosthesis without complication   PMFS History: Patient Active Problem List   Diagnosis Date Noted   Traumatic closed displaced fracture of neck of left femur, initial encounter (HCC) 12/29/2022   Essential hypertension 12/29/2022   Gastroesophageal reflux disease 12/29/2022   Hypercholesterolemia 12/29/2022   Osteopenia 12/29/2022   Type 2 diabetes mellitus (HCC) 12/29/2022   Lumbar radiculopathy 08/21/2020   Primary osteoarthritis of left knee 01/03/2020   Primary  osteoarthritis of right knee 01/03/2020   Past Medical History:  Diagnosis Date   Allergy    seasonal    Cancer (HCC)    breast CA (1970), Uterine CA (2003)   Cataract    B cataracts extracted 2019   Diabetes mellitus without complication (HCC)    GERD (gastroesophageal reflux disease)    Hiatal hernia    Hyperlipidemia    Hypertension    Osteopenia     No family history on file.  Past Surgical History:  Procedure Laterality Date   BREAST BIOPSY     BREAST LUMPECTOMY Left    1970   TOTAL HIP ARTHROPLASTY Left 12/31/2022   Procedure: TOTAL HIP ARTHROPLASTY ANTERIOR APPROACH;  Surgeon: Tarry Kos, MD;  Location: [facility] OR;  Service: Orthopedics;  Laterality: Left;   Social History   Occupational History   Not on file  Tobacco Use   Smoking status: Never   Smokeless tobacco: Never  Substance and Sexual Activity   Alcohol use: Yes    Alcohol/week: 2.0 standard drinks of alcohol    Types: 2 Glasses of wine per week   Drug use: Never   Sexual activity: Not on file

## 2023-02-25 DIAGNOSIS — K219 Gastro-esophageal reflux disease without esophagitis: Secondary | ICD-10-CM | POA: Diagnosis not present

## 2023-02-25 DIAGNOSIS — E785 Hyperlipidemia, unspecified: Secondary | ICD-10-CM | POA: Diagnosis not present

## 2023-02-25 DIAGNOSIS — M8580 Other specified disorders of bone density and structure, unspecified site: Secondary | ICD-10-CM | POA: Diagnosis not present

## 2023-02-25 DIAGNOSIS — Z96642 Presence of left artificial hip joint: Secondary | ICD-10-CM | POA: Diagnosis not present

## 2023-02-25 DIAGNOSIS — S72012D Unspecified intracapsular fracture of left femur, subsequent encounter for closed fracture with routine healing: Secondary | ICD-10-CM | POA: Diagnosis not present

## 2023-02-25 DIAGNOSIS — Z7984 Long term (current) use of oral hypoglycemic drugs: Secondary | ICD-10-CM | POA: Diagnosis not present

## 2023-02-25 DIAGNOSIS — I10 Essential (primary) hypertension: Secondary | ICD-10-CM | POA: Diagnosis not present

## 2023-02-25 DIAGNOSIS — E119 Type 2 diabetes mellitus without complications: Secondary | ICD-10-CM | POA: Diagnosis not present

## 2023-02-25 DIAGNOSIS — Z79891 Long term (current) use of opiate analgesic: Secondary | ICD-10-CM | POA: Diagnosis not present

## 2023-02-26 DIAGNOSIS — Z96642 Presence of left artificial hip joint: Secondary | ICD-10-CM | POA: Diagnosis not present

## 2023-02-26 DIAGNOSIS — K219 Gastro-esophageal reflux disease without esophagitis: Secondary | ICD-10-CM | POA: Diagnosis not present

## 2023-02-26 DIAGNOSIS — S72012D Unspecified intracapsular fracture of left femur, subsequent encounter for closed fracture with routine healing: Secondary | ICD-10-CM | POA: Diagnosis not present

## 2023-02-26 DIAGNOSIS — E785 Hyperlipidemia, unspecified: Secondary | ICD-10-CM | POA: Diagnosis not present

## 2023-02-26 DIAGNOSIS — Z79891 Long term (current) use of opiate analgesic: Secondary | ICD-10-CM | POA: Diagnosis not present

## 2023-02-26 DIAGNOSIS — I10 Essential (primary) hypertension: Secondary | ICD-10-CM | POA: Diagnosis not present

## 2023-02-26 DIAGNOSIS — Z7984 Long term (current) use of oral hypoglycemic drugs: Secondary | ICD-10-CM | POA: Diagnosis not present

## 2023-02-26 DIAGNOSIS — E119 Type 2 diabetes mellitus without complications: Secondary | ICD-10-CM | POA: Diagnosis not present

## 2023-02-26 DIAGNOSIS — M8580 Other specified disorders of bone density and structure, unspecified site: Secondary | ICD-10-CM | POA: Diagnosis not present

## 2023-02-27 DIAGNOSIS — M8580 Other specified disorders of bone density and structure, unspecified site: Secondary | ICD-10-CM | POA: Diagnosis not present

## 2023-02-27 DIAGNOSIS — K219 Gastro-esophageal reflux disease without esophagitis: Secondary | ICD-10-CM | POA: Diagnosis not present

## 2023-02-27 DIAGNOSIS — Z7984 Long term (current) use of oral hypoglycemic drugs: Secondary | ICD-10-CM | POA: Diagnosis not present

## 2023-02-27 DIAGNOSIS — S72012D Unspecified intracapsular fracture of left femur, subsequent encounter for closed fracture with routine healing: Secondary | ICD-10-CM | POA: Diagnosis not present

## 2023-02-27 DIAGNOSIS — E785 Hyperlipidemia, unspecified: Secondary | ICD-10-CM | POA: Diagnosis not present

## 2023-02-27 DIAGNOSIS — Z96642 Presence of left artificial hip joint: Secondary | ICD-10-CM | POA: Diagnosis not present

## 2023-02-27 DIAGNOSIS — Z79891 Long term (current) use of opiate analgesic: Secondary | ICD-10-CM | POA: Diagnosis not present

## 2023-02-27 DIAGNOSIS — I10 Essential (primary) hypertension: Secondary | ICD-10-CM | POA: Diagnosis not present

## 2023-02-27 DIAGNOSIS — E119 Type 2 diabetes mellitus without complications: Secondary | ICD-10-CM | POA: Diagnosis not present

## 2023-03-04 DIAGNOSIS — Z79891 Long term (current) use of opiate analgesic: Secondary | ICD-10-CM | POA: Diagnosis not present

## 2023-03-04 DIAGNOSIS — S72012D Unspecified intracapsular fracture of left femur, subsequent encounter for closed fracture with routine healing: Secondary | ICD-10-CM | POA: Diagnosis not present

## 2023-03-04 DIAGNOSIS — K219 Gastro-esophageal reflux disease without esophagitis: Secondary | ICD-10-CM | POA: Diagnosis not present

## 2023-03-04 DIAGNOSIS — E119 Type 2 diabetes mellitus without complications: Secondary | ICD-10-CM | POA: Diagnosis not present

## 2023-03-04 DIAGNOSIS — Z7984 Long term (current) use of oral hypoglycemic drugs: Secondary | ICD-10-CM | POA: Diagnosis not present

## 2023-03-04 DIAGNOSIS — Z96642 Presence of left artificial hip joint: Secondary | ICD-10-CM | POA: Diagnosis not present

## 2023-03-04 DIAGNOSIS — I10 Essential (primary) hypertension: Secondary | ICD-10-CM | POA: Diagnosis not present

## 2023-03-04 DIAGNOSIS — M8580 Other specified disorders of bone density and structure, unspecified site: Secondary | ICD-10-CM | POA: Diagnosis not present

## 2023-03-04 DIAGNOSIS — E785 Hyperlipidemia, unspecified: Secondary | ICD-10-CM | POA: Diagnosis not present

## 2023-04-08 ENCOUNTER — Other Ambulatory Visit: Payer: Self-pay | Admitting: Family Medicine

## 2023-04-08 DIAGNOSIS — Z1231 Encounter for screening mammogram for malignant neoplasm of breast: Secondary | ICD-10-CM

## 2023-04-15 ENCOUNTER — Ambulatory Visit
Admission: RE | Admit: 2023-04-15 | Discharge: 2023-04-15 | Disposition: A | Discharge status: Home / Self Care | Payer: Medicare PPO | Source: Ambulatory Visit | Attending: Family Medicine | Admitting: Family Medicine

## 2023-04-15 DIAGNOSIS — Z1231 Encounter for screening mammogram for malignant neoplasm of breast: Secondary | ICD-10-CM | POA: Diagnosis not present

## 2023-04-23 ENCOUNTER — Telehealth: Payer: Self-pay | Admitting: Orthopaedic Surgery

## 2023-04-23 NOTE — Telephone Encounter (Signed)
Letter faxed.

## 2023-04-23 NOTE — Telephone Encounter (Signed)
Patient is at the dentist. She is getting her teeth cleaned. Boyd Kerbs would like in writing if patient needs medication.  Fax [phone] her cb #[phone]

## 2023-06-03 DIAGNOSIS — H5213 Myopia, bilateral: Secondary | ICD-10-CM | POA: Diagnosis not present

## 2023-06-03 DIAGNOSIS — E119 Type 2 diabetes mellitus without complications: Secondary | ICD-10-CM | POA: Diagnosis not present

## 2023-06-30 ENCOUNTER — Other Ambulatory Visit: Payer: Self-pay | Admitting: Family Medicine

## 2023-06-30 DIAGNOSIS — Z0000 Encounter for general adult medical examination without abnormal findings: Secondary | ICD-10-CM | POA: Diagnosis not present

## 2023-06-30 DIAGNOSIS — K219 Gastro-esophageal reflux disease without esophagitis: Secondary | ICD-10-CM | POA: Diagnosis not present

## 2023-06-30 DIAGNOSIS — E1169 Type 2 diabetes mellitus with other specified complication: Secondary | ICD-10-CM | POA: Diagnosis not present

## 2023-06-30 DIAGNOSIS — I10 Essential (primary) hypertension: Secondary | ICD-10-CM | POA: Diagnosis not present

## 2023-06-30 DIAGNOSIS — E7800 Pure hypercholesterolemia, unspecified: Secondary | ICD-10-CM | POA: Diagnosis not present

## 2023-06-30 DIAGNOSIS — M8588 Other specified disorders of bone density and structure, other site: Secondary | ICD-10-CM

## 2023-06-30 DIAGNOSIS — J309 Allergic rhinitis, unspecified: Secondary | ICD-10-CM | POA: Diagnosis not present

## 2023-06-30 DIAGNOSIS — Z1231 Encounter for screening mammogram for malignant neoplasm of breast: Secondary | ICD-10-CM

## 2023-06-30 DIAGNOSIS — R2689 Other abnormalities of gait and mobility: Secondary | ICD-10-CM | POA: Diagnosis not present

## 2023-06-30 DIAGNOSIS — M8580 Other specified disorders of bone density and structure, unspecified site: Secondary | ICD-10-CM | POA: Diagnosis not present

## 2023-06-30 DIAGNOSIS — Z9989 Dependence on other enabling machines and devices: Secondary | ICD-10-CM | POA: Diagnosis not present

## 2024-04-19 ENCOUNTER — Other Ambulatory Visit: Payer: Medicare PPO

## 2024-04-19 ENCOUNTER — Ambulatory Visit
Admission: RE | Admit: 2024-04-19 | Discharge: 2024-04-19 | Disposition: A | Discharge status: Home / Self Care | Payer: Medicare PPO | Source: Ambulatory Visit | Attending: Family Medicine | Admitting: Family Medicine

## 2024-04-19 DIAGNOSIS — Z1231 Encounter for screening mammogram for malignant neoplasm of breast: Secondary | ICD-10-CM
